# Patient Record
Sex: Male | Born: 1945 | Race: White | Hispanic: No | Marital: Married | Smoking: Never smoker
Health system: Southern US, Community
[De-identification: ages and names within clinical notes are randomized; demographics above are authoritative.]

## PROBLEM LIST (undated history)

## (undated) DIAGNOSIS — C801 Malignant (primary) neoplasm, unspecified: Secondary | ICD-10-CM

## (undated) DIAGNOSIS — N35919 Unspecified urethral stricture, male, unspecified site: Secondary | ICD-10-CM

## (undated) DIAGNOSIS — I10 Essential (primary) hypertension: Secondary | ICD-10-CM

## (undated) DIAGNOSIS — G629 Polyneuropathy, unspecified: Secondary | ICD-10-CM

## (undated) DIAGNOSIS — I2510 Atherosclerotic heart disease of native coronary artery without angina pectoris: Secondary | ICD-10-CM

## (undated) DIAGNOSIS — C679 Malignant neoplasm of bladder, unspecified: Secondary | ICD-10-CM

## (undated) DIAGNOSIS — C61 Malignant neoplasm of prostate: Secondary | ICD-10-CM

## (undated) HISTORY — PX: OTHER SURGICAL HISTORY: SHX169

## (undated) HISTORY — PX: INSERTION PROSTATE RADIATION SEED: SUR718

## (undated) HISTORY — DX: Polyneuropathy, unspecified: G62.9

## (undated) HISTORY — DX: Unspecified urethral stricture, male, unspecified site: N35.919

## (undated) HISTORY — PX: REFRACTIVE SURGERY: SHX103

## (undated) HISTORY — PX: CORONARY ARTERY BYPASS GRAFT: SHX141

## (undated) HISTORY — DX: Malignant (primary) neoplasm, unspecified: C80.1

## (undated) HISTORY — DX: Atherosclerotic heart disease of native coronary artery without angina pectoris: I25.10

---

## 1997-09-10 HISTORY — PX: OTHER SURGICAL HISTORY: SHX169

## 1998-02-08 ENCOUNTER — Encounter (HOSPITAL_COMMUNITY): Admission: RE | Admit: 1998-02-08 | Discharge: 1998-05-09 | Payer: Self-pay | Admitting: Cardiology

## 1998-11-12 ENCOUNTER — Ambulatory Visit (HOSPITAL_COMMUNITY): Admission: RE | Admit: 1998-11-12 | Discharge: 1998-11-12 | Payer: Self-pay

## 2000-01-30 ENCOUNTER — Encounter: Payer: Self-pay | Admitting: Hematology and Oncology

## 2000-01-30 ENCOUNTER — Encounter: Admission: RE | Admit: 2000-01-30 | Discharge: 2000-01-30 | Payer: Self-pay | Admitting: Hematology and Oncology

## 2001-03-16 ENCOUNTER — Encounter: Admission: RE | Admit: 2001-03-16 | Discharge: 2001-03-16 | Payer: Self-pay | Admitting: Hematology and Oncology

## 2001-03-16 ENCOUNTER — Encounter: Payer: Self-pay | Admitting: Hematology and Oncology

## 2002-03-07 ENCOUNTER — Ambulatory Visit (HOSPITAL_COMMUNITY): Admission: RE | Admit: 2002-03-07 | Discharge: 2002-03-07 | Payer: Self-pay | Admitting: Gastroenterology

## 2005-06-04 ENCOUNTER — Encounter (INDEPENDENT_AMBULATORY_CARE_PROVIDER_SITE_OTHER): Payer: Self-pay | Admitting: *Deleted

## 2005-06-04 ENCOUNTER — Ambulatory Visit (HOSPITAL_COMMUNITY): Admission: RE | Admit: 2005-06-04 | Discharge: 2005-06-04 | Payer: Self-pay | Admitting: Gastroenterology

## 2005-07-31 ENCOUNTER — Encounter (INDEPENDENT_AMBULATORY_CARE_PROVIDER_SITE_OTHER): Payer: Self-pay | Admitting: *Deleted

## 2005-07-31 ENCOUNTER — Inpatient Hospital Stay (HOSPITAL_COMMUNITY): Admission: RE | Admit: 2005-07-31 | Discharge: 2005-08-05 | Payer: Self-pay | Admitting: Surgery

## 2005-08-10 ENCOUNTER — Inpatient Hospital Stay (HOSPITAL_COMMUNITY): Admission: EM | Admit: 2005-08-10 | Discharge: 2005-08-18 | Payer: Self-pay | Admitting: Emergency Medicine

## 2005-08-11 ENCOUNTER — Ambulatory Visit: Payer: Self-pay | Admitting: Internal Medicine

## 2005-09-02 ENCOUNTER — Ambulatory Visit: Payer: Self-pay | Admitting: Hematology and Oncology

## 2005-09-10 ENCOUNTER — Ambulatory Visit (HOSPITAL_BASED_OUTPATIENT_CLINIC_OR_DEPARTMENT_OTHER): Admission: RE | Admit: 2005-09-10 | Discharge: 2005-09-10 | Payer: Self-pay | Admitting: Surgery

## 2005-09-12 ENCOUNTER — Ambulatory Visit (HOSPITAL_COMMUNITY): Admission: RE | Admit: 2005-09-12 | Discharge: 2005-09-12 | Payer: Self-pay | Admitting: Hematology and Oncology

## 2005-10-15 ENCOUNTER — Ambulatory Visit: Payer: Self-pay | Admitting: Family Medicine

## 2005-10-21 ENCOUNTER — Ambulatory Visit: Payer: Self-pay | Admitting: Hematology and Oncology

## 2005-10-23 ENCOUNTER — Ambulatory Visit: Admission: RE | Admit: 2005-10-23 | Discharge: 2005-10-23 | Payer: Self-pay | Admitting: Hematology and Oncology

## 2005-10-23 ENCOUNTER — Encounter (INDEPENDENT_AMBULATORY_CARE_PROVIDER_SITE_OTHER): Payer: Self-pay | Admitting: Cardiology

## 2005-10-31 ENCOUNTER — Ambulatory Visit: Payer: Self-pay | Admitting: Family Medicine

## 2005-11-14 ENCOUNTER — Ambulatory Visit: Payer: Self-pay | Admitting: Family Medicine

## 2005-12-05 ENCOUNTER — Ambulatory Visit: Payer: Self-pay | Admitting: Family Medicine

## 2005-12-10 ENCOUNTER — Ambulatory Visit: Payer: Self-pay | Admitting: Hematology and Oncology

## 2005-12-26 ENCOUNTER — Ambulatory Visit: Payer: Self-pay | Admitting: Family Medicine

## 2006-02-11 ENCOUNTER — Ambulatory Visit: Payer: Self-pay | Admitting: Hematology and Oncology

## 2006-02-11 LAB — CBC WITH DIFFERENTIAL/PLATELET
BASO%: 0.3 % (ref 0.0–2.0)
Eosinophils Absolute: 0.1 10*3/uL (ref 0.0–0.5)
MCHC: 34.2 g/dL (ref 32.0–35.9)
MONO#: 0.6 10*3/uL (ref 0.1–0.9)
MONO%: 10.9 % (ref 0.0–13.0)
NEUT#: 3 10*3/uL (ref 1.5–6.5)
RBC: 4.3 10*6/uL (ref 4.20–5.71)
RDW: 15.9 % — ABNORMAL HIGH (ref 11.2–14.6)
WBC: 5.4 10*3/uL (ref 4.0–10.0)

## 2006-02-11 LAB — COMPREHENSIVE METABOLIC PANEL
ALT: 25 U/L (ref 0–40)
Albumin: 4.3 g/dL (ref 3.5–5.2)
Alkaline Phosphatase: 52 U/L (ref 39–117)
CO2: 27 mEq/L (ref 19–32)
Glucose, Bld: 212 mg/dL — ABNORMAL HIGH (ref 70–99)
Potassium: 4.6 mEq/L (ref 3.5–5.3)
Sodium: 140 mEq/L (ref 135–145)
Total Bilirubin: 0.5 mg/dL (ref 0.3–1.2)
Total Protein: 6.9 g/dL (ref 6.0–8.3)

## 2006-03-11 ENCOUNTER — Ambulatory Visit (HOSPITAL_COMMUNITY): Admission: RE | Admit: 2006-03-11 | Discharge: 2006-03-11 | Payer: Self-pay | Admitting: Hematology and Oncology

## 2006-03-16 LAB — CBC WITH DIFFERENTIAL/PLATELET
BASO%: 0.3 % (ref 0.0–2.0)
EOS%: 1 % (ref 0.0–7.0)
LYMPH%: 27 % (ref 14.0–48.0)
MCHC: 34.5 g/dL (ref 32.0–35.9)
MCV: 94.4 fL (ref 81.6–98.0)
MONO%: 11.1 % (ref 0.0–13.0)
Platelets: 224 10*3/uL (ref 145–400)
RBC: 4.37 10*6/uL (ref 4.20–5.71)
RDW: 14.7 % — ABNORMAL HIGH (ref 11.2–14.6)

## 2006-03-16 LAB — COMPREHENSIVE METABOLIC PANEL
Albumin: 4.2 g/dL (ref 3.5–5.2)
BUN: 20 mg/dL (ref 6–23)
CO2: 28 mEq/L (ref 19–32)
Calcium: 9.6 mg/dL (ref 8.4–10.5)
Chloride: 100 mEq/L (ref 96–112)
Glucose, Bld: 255 mg/dL — ABNORMAL HIGH (ref 70–99)
Potassium: 4.9 mEq/L (ref 3.5–5.3)
Total Protein: 7 g/dL (ref 6.0–8.3)

## 2006-03-27 ENCOUNTER — Ambulatory Visit: Payer: Self-pay | Admitting: Family Medicine

## 2006-04-27 ENCOUNTER — Ambulatory Visit: Payer: Self-pay | Admitting: Hematology and Oncology

## 2006-04-27 ENCOUNTER — Ambulatory Visit: Payer: Self-pay | Admitting: Family Medicine

## 2006-05-18 ENCOUNTER — Ambulatory Visit: Payer: Self-pay | Admitting: Family Medicine

## 2006-06-08 ENCOUNTER — Ambulatory Visit: Payer: Self-pay | Admitting: Family Medicine

## 2006-06-17 ENCOUNTER — Ambulatory Visit: Payer: Self-pay | Admitting: Hematology and Oncology

## 2006-06-19 LAB — CBC WITH DIFFERENTIAL/PLATELET
Basophils Absolute: 0 10*3/uL (ref 0.0–0.1)
EOS%: 1.8 % (ref 0.0–7.0)
Eosinophils Absolute: 0.1 10*3/uL (ref 0.0–0.5)
HGB: 14.2 g/dL (ref 13.0–17.1)
MCV: 94.6 fL (ref 81.6–98.0)
MONO%: 11 % (ref 0.0–13.0)
NEUT#: 2.8 10*3/uL (ref 1.5–6.5)
RBC: 4.41 10*6/uL (ref 4.20–5.71)
RDW: 14.2 % (ref 11.2–14.6)
WBC: 4.9 10*3/uL (ref 4.0–10.0)
lymph#: 1.5 10*3/uL (ref 0.9–3.3)

## 2006-06-19 LAB — COMPREHENSIVE METABOLIC PANEL
ALT: 23 U/L (ref 0–40)
Albumin: 4.4 g/dL (ref 3.5–5.2)
Alkaline Phosphatase: 48 U/L (ref 39–117)
CO2: 23 mEq/L (ref 19–32)
Potassium: 4.8 mEq/L (ref 3.5–5.3)
Sodium: 137 mEq/L (ref 135–145)
Total Bilirubin: 0.6 mg/dL (ref 0.3–1.2)
Total Protein: 7.1 g/dL (ref 6.0–8.3)

## 2006-06-19 LAB — CEA: CEA: 1.5 ng/mL (ref 0.0–5.0)

## 2006-06-26 ENCOUNTER — Ambulatory Visit: Payer: Self-pay | Admitting: Physical Medicine & Rehabilitation

## 2006-06-29 ENCOUNTER — Ambulatory Visit: Payer: Self-pay | Admitting: Family Medicine

## 2006-07-02 ENCOUNTER — Ambulatory Visit: Payer: Self-pay | Admitting: Family Medicine

## 2006-07-09 ENCOUNTER — Ambulatory Visit: Payer: Self-pay | Admitting: Family Medicine

## 2006-08-14 ENCOUNTER — Ambulatory Visit: Payer: Self-pay | Admitting: Physical Medicine & Rehabilitation

## 2006-08-18 DIAGNOSIS — D510 Vitamin B12 deficiency anemia due to intrinsic factor deficiency: Secondary | ICD-10-CM | POA: Insufficient documentation

## 2006-08-18 DIAGNOSIS — E7800 Pure hypercholesterolemia, unspecified: Secondary | ICD-10-CM | POA: Insufficient documentation

## 2006-08-18 DIAGNOSIS — F5232 Male orgasmic disorder: Secondary | ICD-10-CM | POA: Insufficient documentation

## 2006-08-18 DIAGNOSIS — E119 Type 2 diabetes mellitus without complications: Secondary | ICD-10-CM | POA: Insufficient documentation

## 2006-08-18 DIAGNOSIS — I10 Essential (primary) hypertension: Secondary | ICD-10-CM | POA: Insufficient documentation

## 2006-08-18 DIAGNOSIS — I2510 Atherosclerotic heart disease of native coronary artery without angina pectoris: Secondary | ICD-10-CM | POA: Insufficient documentation

## 2006-08-18 DIAGNOSIS — G609 Hereditary and idiopathic neuropathy, unspecified: Secondary | ICD-10-CM | POA: Insufficient documentation

## 2006-08-21 ENCOUNTER — Ambulatory Visit: Payer: Self-pay | Admitting: Family Medicine

## 2006-09-16 ENCOUNTER — Ambulatory Visit: Payer: Self-pay | Admitting: Hematology and Oncology

## 2006-09-18 ENCOUNTER — Encounter: Payer: Self-pay | Admitting: Family Medicine

## 2006-09-18 LAB — CBC WITH DIFFERENTIAL/PLATELET
BASO%: 0.3 % (ref 0.0–2.0)
Eosinophils Absolute: 0.1 10*3/uL (ref 0.0–0.5)
HCT: 44.7 % (ref 38.7–49.9)
MCHC: 34.9 g/dL (ref 32.0–35.9)
MONO#: 0.7 10*3/uL (ref 0.1–0.9)
NEUT#: 4.9 10*3/uL (ref 1.5–6.5)
RBC: 4.75 10*6/uL (ref 4.20–5.71)
WBC: 7.7 10*3/uL (ref 4.0–10.0)
lymph#: 2 10*3/uL (ref 0.9–3.3)

## 2006-09-18 LAB — COMPREHENSIVE METABOLIC PANEL
ALT: 29 U/L (ref 0–53)
Albumin: 4.5 g/dL (ref 3.5–5.2)
CO2: 27 mEq/L (ref 19–32)
Calcium: 10 mg/dL (ref 8.4–10.5)
Chloride: 101 mEq/L (ref 96–112)
Glucose, Bld: 203 mg/dL — ABNORMAL HIGH (ref 70–99)
Sodium: 138 mEq/L (ref 135–145)
Total Protein: 7.5 g/dL (ref 6.0–8.3)

## 2006-09-18 LAB — CONVERTED CEMR LAB
HCT: 44.7 %
WBC, blood: 7.7 10*3/uL

## 2006-09-18 LAB — CEA: CEA: 1.9 ng/mL (ref 0.0–5.0)

## 2006-09-21 ENCOUNTER — Ambulatory Visit (HOSPITAL_COMMUNITY): Admission: RE | Admit: 2006-09-21 | Discharge: 2006-09-21 | Payer: Self-pay | Admitting: Hematology and Oncology

## 2006-10-19 ENCOUNTER — Telehealth (INDEPENDENT_AMBULATORY_CARE_PROVIDER_SITE_OTHER): Payer: Self-pay | Admitting: *Deleted

## 2006-10-19 ENCOUNTER — Ambulatory Visit: Payer: Self-pay | Admitting: Family Medicine

## 2006-10-19 DIAGNOSIS — N3000 Acute cystitis without hematuria: Secondary | ICD-10-CM | POA: Insufficient documentation

## 2006-10-21 ENCOUNTER — Telehealth: Payer: Self-pay | Admitting: Family Medicine

## 2006-10-22 ENCOUNTER — Encounter: Payer: Self-pay | Admitting: Family Medicine

## 2006-10-22 LAB — CONVERTED CEMR LAB
BUN: 20 mg/dL (ref 6–23)
CO2: 23 meq/L (ref 19–32)
Chloride: 102 meq/L (ref 96–112)
Creatinine, Ser: 1.17 mg/dL (ref 0.40–1.50)

## 2006-10-23 ENCOUNTER — Telehealth: Payer: Self-pay | Admitting: Family Medicine

## 2006-10-28 ENCOUNTER — Telehealth (INDEPENDENT_AMBULATORY_CARE_PROVIDER_SITE_OTHER): Payer: Self-pay | Admitting: *Deleted

## 2006-10-28 ENCOUNTER — Encounter: Payer: Self-pay | Admitting: Family Medicine

## 2006-11-13 ENCOUNTER — Ambulatory Visit (HOSPITAL_BASED_OUTPATIENT_CLINIC_OR_DEPARTMENT_OTHER): Admission: RE | Admit: 2006-11-13 | Discharge: 2006-11-13 | Payer: Self-pay | Admitting: Surgery

## 2006-11-17 ENCOUNTER — Encounter: Payer: Self-pay | Admitting: Family Medicine

## 2006-12-07 ENCOUNTER — Ambulatory Visit: Payer: Self-pay | Admitting: Family Medicine

## 2006-12-07 ENCOUNTER — Telehealth: Payer: Self-pay | Admitting: Family Medicine

## 2006-12-07 LAB — CONVERTED CEMR LAB: Microalbumin U total vol: NORMAL mg/L

## 2007-01-07 ENCOUNTER — Encounter: Payer: Self-pay | Admitting: Family Medicine

## 2007-01-08 LAB — CONVERTED CEMR LAB
ALT: 27 units/L (ref 0–53)
AST: 23 units/L (ref 0–37)
Total CHOL/HDL Ratio: 2.4
VLDL: 18 mg/dL (ref 0–40)

## 2007-01-11 ENCOUNTER — Ambulatory Visit: Payer: Self-pay | Admitting: Family Medicine

## 2007-01-15 ENCOUNTER — Ambulatory Visit: Payer: Self-pay | Admitting: Physical Medicine & Rehabilitation

## 2007-01-19 ENCOUNTER — Ambulatory Visit: Payer: Self-pay | Admitting: Hematology and Oncology

## 2007-01-22 LAB — COMPREHENSIVE METABOLIC PANEL
ALT: 29 U/L (ref 0–53)
BUN: 24 mg/dL — ABNORMAL HIGH (ref 6–23)
CO2: 24 mEq/L (ref 19–32)
Calcium: 9.3 mg/dL (ref 8.4–10.5)
Chloride: 104 mEq/L (ref 96–112)
Creatinine, Ser: 1.21 mg/dL (ref 0.40–1.50)
Glucose, Bld: 189 mg/dL — ABNORMAL HIGH (ref 70–99)
Total Bilirubin: 0.6 mg/dL (ref 0.3–1.2)

## 2007-01-22 LAB — CBC WITH DIFFERENTIAL/PLATELET
BASO%: 0.5 % (ref 0.0–2.0)
EOS%: 2 % (ref 0.0–7.0)
HCT: 41.5 % (ref 38.7–49.9)
LYMPH%: 23.8 % (ref 14.0–48.0)
MCH: 32.1 pg (ref 28.0–33.4)
MCHC: 34.5 g/dL (ref 32.0–35.9)
MONO#: 0.6 10*3/uL (ref 0.1–0.9)
MONO%: 10.2 % (ref 0.0–13.0)
NEUT%: 63.5 % (ref 40.0–75.0)
Platelets: 193 10*3/uL (ref 145–400)
RBC: 4.47 10*6/uL (ref 4.20–5.71)
WBC: 6.1 10*3/uL (ref 4.0–10.0)

## 2007-01-22 LAB — CEA: CEA: 1.5 ng/mL (ref 0.0–5.0)

## 2007-03-19 ENCOUNTER — Ambulatory Visit: Payer: Self-pay | Admitting: Physical Medicine & Rehabilitation

## 2007-03-29 ENCOUNTER — Telehealth: Payer: Self-pay | Admitting: Family Medicine

## 2007-03-29 ENCOUNTER — Ambulatory Visit: Payer: Self-pay | Admitting: Family Medicine

## 2007-06-09 ENCOUNTER — Ambulatory Visit: Payer: Self-pay | Admitting: Hematology and Oncology

## 2007-06-11 ENCOUNTER — Ambulatory Visit (HOSPITAL_COMMUNITY): Admission: RE | Admit: 2007-06-11 | Discharge: 2007-06-11 | Payer: Self-pay | Admitting: Hematology and Oncology

## 2007-06-11 LAB — COMPREHENSIVE METABOLIC PANEL
ALT: 34 U/L (ref 0–53)
BUN: 35 mg/dL — ABNORMAL HIGH (ref 6–23)
CO2: 26 mEq/L (ref 19–32)
Creatinine, Ser: 1.42 mg/dL (ref 0.40–1.50)
Total Bilirubin: 0.8 mg/dL (ref 0.3–1.2)

## 2007-06-11 LAB — CBC WITH DIFFERENTIAL/PLATELET
HCT: 42.8 % (ref 38.7–49.9)
HGB: 15.1 g/dL (ref 13.0–17.1)
LYMPH%: 34.4 % (ref 14.0–48.0)
MCH: 32.7 pg (ref 28.0–33.4)
MONO#: 0.7 10*3/uL (ref 0.1–0.9)
MONO%: 11 % (ref 0.0–13.0)
NEUT%: 52.5 % (ref 40.0–75.0)
RDW: 13.4 % (ref 11.2–14.6)
WBC: 6.8 10*3/uL (ref 4.0–10.0)
lymph#: 2.3 10*3/uL (ref 0.9–3.3)

## 2007-06-11 LAB — CEA: CEA: 1.5 ng/mL (ref 0.0–5.0)

## 2007-06-16 ENCOUNTER — Encounter: Payer: Self-pay | Admitting: Family Medicine

## 2007-06-18 LAB — CONVERTED CEMR LAB
Albumin: 4.2 g/dL
CO2: 26 meq/L
Glucose, Bld: 198 mg/dL
Potassium: 4.8 meq/L
Sodium: 138 meq/L
Total Protein: 7 g/dL

## 2007-06-28 ENCOUNTER — Encounter: Payer: Self-pay | Admitting: Family Medicine

## 2007-06-30 ENCOUNTER — Ambulatory Visit: Payer: Self-pay | Admitting: Family Medicine

## 2007-07-01 ENCOUNTER — Encounter: Payer: Self-pay | Admitting: Family Medicine

## 2007-07-02 ENCOUNTER — Ambulatory Visit: Payer: Self-pay | Admitting: Physical Medicine & Rehabilitation

## 2007-07-19 ENCOUNTER — Encounter: Payer: Self-pay | Admitting: Family Medicine

## 2007-08-06 ENCOUNTER — Encounter: Payer: Self-pay | Admitting: Family Medicine

## 2007-08-16 ENCOUNTER — Encounter: Payer: Self-pay | Admitting: Family Medicine

## 2007-08-31 ENCOUNTER — Encounter: Payer: Self-pay | Admitting: Family Medicine

## 2007-10-01 ENCOUNTER — Ambulatory Visit: Payer: Self-pay | Admitting: Family Medicine

## 2007-10-01 ENCOUNTER — Ambulatory Visit: Payer: Self-pay | Admitting: Physical Medicine & Rehabilitation

## 2007-10-01 DIAGNOSIS — M545 Low back pain, unspecified: Secondary | ICD-10-CM | POA: Insufficient documentation

## 2007-10-01 LAB — CONVERTED CEMR LAB: Hgb A1c MFr Bld: 7.5 %

## 2007-12-06 ENCOUNTER — Encounter: Payer: Self-pay | Admitting: Family Medicine

## 2007-12-13 ENCOUNTER — Telehealth: Payer: Self-pay | Admitting: Family Medicine

## 2007-12-14 ENCOUNTER — Encounter: Payer: Self-pay | Admitting: Family Medicine

## 2008-01-11 ENCOUNTER — Ambulatory Visit: Payer: Self-pay | Admitting: Hematology and Oncology

## 2008-01-13 LAB — CBC WITH DIFFERENTIAL/PLATELET
BASO%: 0.4 % (ref 0.0–2.0)
Basophils Absolute: 0 10*3/uL (ref 0.0–0.1)
EOS%: 1.5 % (ref 0.0–7.0)
Eosinophils Absolute: 0.1 10*3/uL (ref 0.0–0.5)
HCT: 41.6 % (ref 38.7–49.9)
HGB: 14.4 g/dL (ref 13.0–17.1)
LYMPH%: 30.2 % (ref 14.0–48.0)
MCH: 32 pg (ref 28.0–33.4)
MCHC: 34.6 g/dL (ref 32.0–35.9)
MCV: 92.6 fL (ref 81.6–98.0)
MONO#: 0.6 10*3/uL (ref 0.1–0.9)
MONO%: 9.5 % (ref 0.0–13.0)
NEUT#: 3.7 10*3/uL (ref 1.5–6.5)
NEUT%: 58.4 % (ref 40.0–75.0)
Platelets: 206 10*3/uL (ref 145–400)
RBC: 4.5 10*6/uL (ref 4.20–5.71)
RDW: 13.1 % (ref 11.2–14.6)
WBC: 6.3 10*3/uL (ref 4.0–10.0)
lymph#: 1.9 10*3/uL (ref 0.9–3.3)

## 2008-01-13 LAB — COMPREHENSIVE METABOLIC PANEL
ALT: 29 U/L (ref 0–53)
AST: 21 U/L (ref 0–37)
Albumin: 4.3 g/dL (ref 3.5–5.2)
Alkaline Phosphatase: 45 U/L (ref 39–117)
BUN: 24 mg/dL — ABNORMAL HIGH (ref 6–23)
CO2: 26 mEq/L (ref 19–32)
Calcium: 9.7 mg/dL (ref 8.4–10.5)
Chloride: 103 mEq/L (ref 96–112)
Creatinine, Ser: 1.18 mg/dL (ref 0.40–1.50)
Glucose, Bld: 183 mg/dL — ABNORMAL HIGH (ref 70–99)
Potassium: 5.1 mEq/L (ref 3.5–5.3)
Sodium: 137 mEq/L (ref 135–145)
Total Bilirubin: 0.5 mg/dL (ref 0.3–1.2)
Total Protein: 7.1 g/dL (ref 6.0–8.3)

## 2008-01-13 LAB — CEA: CEA: 1 ng/mL (ref 0.0–5.0)

## 2008-01-13 LAB — PSA, MEDICARE: PSA: 1.04 ng/mL (ref 0.10–4.00)

## 2008-01-17 ENCOUNTER — Ambulatory Visit (HOSPITAL_COMMUNITY): Admission: RE | Admit: 2008-01-17 | Discharge: 2008-01-17 | Payer: Self-pay | Admitting: Hematology and Oncology

## 2008-01-17 ENCOUNTER — Encounter: Payer: Self-pay | Admitting: Family Medicine

## 2008-01-21 ENCOUNTER — Ambulatory Visit: Payer: Self-pay | Admitting: Family Medicine

## 2008-01-21 ENCOUNTER — Ambulatory Visit: Payer: Self-pay | Admitting: Physical Medicine & Rehabilitation

## 2008-01-21 LAB — CONVERTED CEMR LAB

## 2008-01-31 ENCOUNTER — Encounter: Payer: Self-pay | Admitting: Family Medicine

## 2008-02-01 ENCOUNTER — Encounter: Payer: Self-pay | Admitting: Family Medicine

## 2008-02-29 ENCOUNTER — Encounter: Payer: Self-pay | Admitting: Family Medicine

## 2008-02-29 LAB — CONVERTED CEMR LAB
ALT: 33 units/L
AST: 31 units/L
Calcium: 9.7 mg/dL
Chloride: 104 meq/L
Creatinine, Ser: 1 mg/dL
Hgb A1c MFr Bld: 6.4 %
LDL Cholesterol: 68 mg/dL
Microalb, Ur: 0.3 mg/dL
Potassium: 4.6 meq/L

## 2008-03-22 ENCOUNTER — Encounter: Payer: Self-pay | Admitting: Family Medicine

## 2008-05-09 ENCOUNTER — Encounter: Payer: Self-pay | Admitting: Family Medicine

## 2008-05-22 ENCOUNTER — Ambulatory Visit: Payer: Self-pay | Admitting: Family Medicine

## 2008-05-26 ENCOUNTER — Ambulatory Visit: Payer: Self-pay | Admitting: Physical Medicine & Rehabilitation

## 2008-06-21 ENCOUNTER — Encounter: Payer: Self-pay | Admitting: Family Medicine

## 2008-06-21 LAB — CONVERTED CEMR LAB

## 2008-07-26 ENCOUNTER — Ambulatory Visit: Payer: Self-pay | Admitting: Hematology and Oncology

## 2008-07-28 LAB — COMPREHENSIVE METABOLIC PANEL
AST: 28 U/L (ref 0–37)
Albumin: 4.1 g/dL (ref 3.5–5.2)
Alkaline Phosphatase: 43 U/L (ref 39–117)
BUN: 17 mg/dL (ref 6–23)
Creatinine, Ser: 1.19 mg/dL (ref 0.40–1.50)
Glucose, Bld: 171 mg/dL — ABNORMAL HIGH (ref 70–99)
Total Bilirubin: 0.6 mg/dL (ref 0.3–1.2)

## 2008-07-28 LAB — CBC WITH DIFFERENTIAL/PLATELET
Basophils Absolute: 0 10*3/uL (ref 0.0–0.1)
EOS%: 1 % (ref 0.0–7.0)
Eosinophils Absolute: 0.1 10*3/uL (ref 0.0–0.5)
HCT: 43.8 % (ref 38.7–49.9)
HGB: 14.8 g/dL (ref 13.0–17.1)
LYMPH%: 27.1 % (ref 14.0–48.0)
MCH: 32.2 pg (ref 28.0–33.4)
MCV: 95.1 fL (ref 81.6–98.0)
MONO%: 9.8 % (ref 0.0–13.0)
NEUT#: 3.7 10*3/uL (ref 1.5–6.5)
NEUT%: 61.5 % (ref 40.0–75.0)
Platelets: 190 10*3/uL (ref 145–400)
RDW: 13.6 % (ref 11.2–14.6)

## 2008-07-28 LAB — CEA: CEA: 1.3 ng/mL (ref 0.0–5.0)

## 2008-07-31 ENCOUNTER — Ambulatory Visit (HOSPITAL_COMMUNITY): Admission: RE | Admit: 2008-07-31 | Discharge: 2008-07-31 | Payer: Self-pay | Admitting: Hematology and Oncology

## 2008-08-08 ENCOUNTER — Encounter: Payer: Self-pay | Admitting: Family Medicine

## 2008-08-09 ENCOUNTER — Ambulatory Visit: Payer: Self-pay | Admitting: Family Medicine

## 2008-08-29 ENCOUNTER — Ambulatory Visit: Payer: Self-pay | Admitting: Family Medicine

## 2008-09-01 ENCOUNTER — Encounter: Payer: Self-pay | Admitting: Family Medicine

## 2008-09-01 LAB — HM COLONOSCOPY: HM Colonoscopy: NORMAL

## 2008-09-12 ENCOUNTER — Encounter: Payer: Self-pay | Admitting: Family Medicine

## 2008-09-22 ENCOUNTER — Ambulatory Visit: Payer: Self-pay | Admitting: Family Medicine

## 2008-09-22 LAB — CONVERTED CEMR LAB: Hgb A1c MFr Bld: 6.9 %

## 2008-10-13 ENCOUNTER — Ambulatory Visit: Payer: Self-pay | Admitting: Physical Medicine & Rehabilitation

## 2008-12-27 ENCOUNTER — Ambulatory Visit: Payer: Self-pay | Admitting: Family Medicine

## 2009-01-10 ENCOUNTER — Telehealth (INDEPENDENT_AMBULATORY_CARE_PROVIDER_SITE_OTHER): Payer: Self-pay | Admitting: *Deleted

## 2009-01-22 ENCOUNTER — Ambulatory Visit: Payer: Self-pay | Admitting: Hematology and Oncology

## 2009-01-24 LAB — CBC WITH DIFFERENTIAL/PLATELET
BASO%: 0 % (ref 0.0–2.0)
EOS%: 1.1 % (ref 0.0–7.0)
HCT: 44.1 % (ref 38.4–49.9)
MCH: 32.2 pg (ref 27.2–33.4)
MCHC: 34.3 g/dL (ref 32.0–36.0)
MCV: 94.1 fL (ref 79.3–98.0)
MONO%: 10.1 % (ref 0.0–14.0)
NEUT%: 58.8 % (ref 39.0–75.0)
RDW: 13 % (ref 11.0–14.6)
lymph#: 2 10*3/uL (ref 0.9–3.3)

## 2009-01-24 LAB — COMPREHENSIVE METABOLIC PANEL
ALT: 27 U/L (ref 0–53)
AST: 22 U/L (ref 0–37)
Alkaline Phosphatase: 52 U/L (ref 39–117)
Calcium: 9.7 mg/dL (ref 8.4–10.5)
Chloride: 104 mEq/L (ref 96–112)
Creatinine, Ser: 1.24 mg/dL (ref 0.40–1.50)

## 2009-01-26 ENCOUNTER — Ambulatory Visit (HOSPITAL_COMMUNITY): Admission: RE | Admit: 2009-01-26 | Discharge: 2009-01-26 | Payer: Self-pay | Admitting: Hematology and Oncology

## 2009-01-26 ENCOUNTER — Encounter: Payer: Self-pay | Admitting: Family Medicine

## 2009-01-31 ENCOUNTER — Encounter: Payer: Self-pay | Admitting: Family Medicine

## 2009-04-18 ENCOUNTER — Ambulatory Visit: Payer: Self-pay | Admitting: Family Medicine

## 2009-04-18 LAB — CONVERTED CEMR LAB: Creatinine,U: 300 mg/dL

## 2009-05-18 ENCOUNTER — Ambulatory Visit: Payer: Self-pay | Admitting: Physical Medicine & Rehabilitation

## 2009-05-29 ENCOUNTER — Ambulatory Visit: Payer: Self-pay | Admitting: Diagnostic Radiology

## 2009-05-29 ENCOUNTER — Emergency Department (HOSPITAL_BASED_OUTPATIENT_CLINIC_OR_DEPARTMENT_OTHER): Admission: EM | Admit: 2009-05-29 | Discharge: 2009-05-29 | Payer: Self-pay | Admitting: Emergency Medicine

## 2009-07-18 ENCOUNTER — Ambulatory Visit: Payer: Self-pay | Admitting: Family Medicine

## 2009-07-24 ENCOUNTER — Encounter: Payer: Self-pay | Admitting: Family Medicine

## 2009-09-21 ENCOUNTER — Ambulatory Visit: Payer: Self-pay | Admitting: Family Medicine

## 2009-11-01 ENCOUNTER — Ambulatory Visit: Payer: Self-pay | Admitting: Family Medicine

## 2009-12-03 ENCOUNTER — Ambulatory Visit: Payer: Self-pay | Admitting: Hematology and Oncology

## 2009-12-03 LAB — CBC WITH DIFFERENTIAL/PLATELET
BASO%: 0.5 % (ref 0.0–2.0)
EOS%: 1.5 % (ref 0.0–7.0)
Eosinophils Absolute: 0.1 10*3/uL (ref 0.0–0.5)
LYMPH%: 34.2 % (ref 14.0–49.0)
MCH: 31.9 pg (ref 27.2–33.4)
MCHC: 33.6 g/dL (ref 32.0–36.0)
MCV: 94.8 fL (ref 79.3–98.0)
MONO%: 11.7 % (ref 0.0–14.0)
NEUT#: 3.6 10*3/uL (ref 1.5–6.5)
Platelets: 227 10*3/uL (ref 140–400)
RBC: 4.72 10*6/uL (ref 4.20–5.82)
RDW: 13.3 % (ref 11.0–14.6)

## 2009-12-03 LAB — CEA: CEA: 1.3 ng/mL (ref 0.0–5.0)

## 2009-12-03 LAB — COMPREHENSIVE METABOLIC PANEL
ALT: 29 U/L (ref 0–53)
AST: 22 U/L (ref 0–37)
Albumin: 4.5 g/dL (ref 3.5–5.2)
Alkaline Phosphatase: 54 U/L (ref 39–117)
Glucose, Bld: 171 mg/dL — ABNORMAL HIGH (ref 70–99)
Potassium: 4.2 mEq/L (ref 3.5–5.3)
Sodium: 138 mEq/L (ref 135–145)
Total Bilirubin: 0.3 mg/dL (ref 0.3–1.2)
Total Protein: 7.4 g/dL (ref 6.0–8.3)

## 2009-12-05 ENCOUNTER — Encounter: Payer: Self-pay | Admitting: Family Medicine

## 2010-01-28 ENCOUNTER — Encounter: Payer: Self-pay | Admitting: Family Medicine

## 2010-02-05 ENCOUNTER — Ambulatory Visit: Payer: Self-pay | Admitting: Family Medicine

## 2010-02-11 ENCOUNTER — Encounter: Payer: Self-pay | Admitting: Family Medicine

## 2010-02-14 ENCOUNTER — Telehealth: Payer: Self-pay | Admitting: Family Medicine

## 2010-02-27 ENCOUNTER — Ambulatory Visit: Payer: Self-pay | Admitting: Family Medicine

## 2010-02-27 LAB — CONVERTED CEMR LAB
Bilirubin Urine: NEGATIVE
Blood in Urine, dipstick: NEGATIVE
Glucose, Urine, Semiquant: NEGATIVE
Hgb A1c MFr Bld: 7.2 %
Ketones, urine, test strip: NEGATIVE
Nitrite: NEGATIVE
Specific Gravity, Urine: >=1.03
pH: 5

## 2010-02-28 ENCOUNTER — Encounter: Payer: Self-pay | Admitting: Family Medicine

## 2010-05-07 ENCOUNTER — Inpatient Hospital Stay (HOSPITAL_COMMUNITY): Admission: EM | Admit: 2010-05-07 | Discharge: 2010-05-09 | Payer: Self-pay | Admitting: Emergency Medicine

## 2010-05-08 ENCOUNTER — Encounter (INDEPENDENT_AMBULATORY_CARE_PROVIDER_SITE_OTHER): Payer: Self-pay | Admitting: Internal Medicine

## 2010-06-10 ENCOUNTER — Encounter: Payer: Self-pay | Admitting: Family Medicine

## 2010-06-24 ENCOUNTER — Encounter: Payer: Self-pay | Admitting: Family Medicine

## 2010-06-25 LAB — CONVERTED CEMR LAB
Albumin: 4.4 g/dL (ref 3.5–5.2)
BUN: 20 mg/dL (ref 6–23)
CO2: 22 meq/L (ref 19–32)
Calcium: 9.3 mg/dL (ref 8.4–10.5)
Chloride: 105 meq/L (ref 96–112)
Cholesterol: 119 mg/dL (ref 0–200)
Creatinine, Ser: 1.19 mg/dL (ref 0.40–1.50)
HDL: 42 mg/dL (ref >39)
Hemoglobin: 12.1 g/dL — ABNORMAL LOW (ref 13.0–17.0)
Lymphocytes Relative: 37 % (ref 12–46)
Lymphs Abs: 1.9 10*3/uL (ref 0.7–4.0)
Monocytes Absolute: 0.6 10*3/uL (ref 0.1–1.0)
Monocytes Relative: 12 % (ref 3–12)
Neutro Abs: 2.5 10*3/uL (ref 1.7–7.7)
Potassium: 4.7 meq/L (ref 3.5–5.3)
RBC: 4.48 M/uL (ref 4.22–5.81)
Total CHOL/HDL Ratio: 2.8
WBC: 5 10*3/uL (ref 4.0–10.5)

## 2010-06-28 ENCOUNTER — Ambulatory Visit: Payer: Self-pay | Admitting: Family Medicine

## 2010-06-28 DIAGNOSIS — K921 Melena: Secondary | ICD-10-CM | POA: Insufficient documentation

## 2010-06-28 LAB — CONVERTED CEMR LAB
Albumin/Creatinine Ratio, Urine, POC: <30
Creatinine,U: 300 mg/dL
Hgb A1c MFr Bld: 6.7 %
Iron: 32 ug/dL — ABNORMAL LOW (ref 42–165)
Saturation Ratios: 7 % — ABNORMAL LOW (ref 20–55)
TIBC: 431 ug/dL (ref 215–435)

## 2010-08-05 ENCOUNTER — Encounter: Payer: Self-pay | Admitting: Family Medicine

## 2010-08-05 LAB — CONVERTED CEMR LAB: Hgb A1c MFr Bld: 6.9 %

## 2010-08-23 ENCOUNTER — Ambulatory Visit: Payer: Self-pay | Admitting: Hematology and Oncology

## 2010-08-26 LAB — CBC WITH DIFFERENTIAL/PLATELET
Basophils Absolute: 0 10*3/uL (ref 0.0–0.1)
EOS%: 2.9 % (ref 0.0–7.0)
HCT: 41.9 % (ref 38.4–49.9)
HGB: 13.9 g/dL (ref 13.0–17.1)
LYMPH%: 33.5 % (ref 14.0–49.0)
MCH: 29 pg (ref 27.2–33.4)
MCHC: 33.1 g/dL (ref 32.0–36.0)
NEUT%: 49.9 % (ref 39.0–75.0)
Platelets: 207 10*3/uL (ref 140–400)
lymph#: 1.8 10*3/uL (ref 0.9–3.3)

## 2010-08-26 LAB — COMPREHENSIVE METABOLIC PANEL
AST: 25 U/L (ref 0–37)
BUN: 21 mg/dL (ref 6–23)
CO2: 29 mEq/L (ref 19–32)
Calcium: 9.4 mg/dL (ref 8.4–10.5)
Chloride: 106 mEq/L (ref 96–112)
Creatinine, Ser: 1.11 mg/dL (ref 0.40–1.50)
Total Bilirubin: 0.8 mg/dL (ref 0.3–1.2)

## 2010-08-27 LAB — CEA: CEA: 1.2 ng/mL (ref 0.0–5.0)

## 2010-08-30 ENCOUNTER — Ambulatory Visit (HOSPITAL_COMMUNITY): Admission: RE | Admit: 2010-08-30 | Discharge: 2010-08-30 | Payer: Self-pay | Admitting: Hematology and Oncology

## 2010-08-30 ENCOUNTER — Encounter: Payer: Self-pay | Admitting: Family Medicine

## 2010-08-30 ENCOUNTER — Ambulatory Visit: Payer: Self-pay | Admitting: Hematology and Oncology

## 2010-09-02 ENCOUNTER — Ambulatory Visit: Payer: Self-pay | Admitting: Family Medicine

## 2010-09-02 DIAGNOSIS — D509 Iron deficiency anemia, unspecified: Secondary | ICD-10-CM | POA: Insufficient documentation

## 2010-09-05 LAB — CONVERTED CEMR LAB
HCT: 45 % (ref 39.0–52.0)
Hemoglobin: 14.8 g/dL (ref 13.0–17.0)
Iron: 119 ug/dL (ref 42–165)
Saturation Ratios: 28 % (ref 20–55)
UIBC: 306 ug/dL

## 2010-11-19 ENCOUNTER — Ambulatory Visit
Admission: RE | Admit: 2010-11-19 | Discharge: 2010-11-19 | Payer: Self-pay | Source: Home / Self Care | Attending: Family Medicine | Admitting: Family Medicine

## 2010-11-19 DIAGNOSIS — J0100 Acute maxillary sinusitis, unspecified: Secondary | ICD-10-CM | POA: Insufficient documentation

## 2010-12-10 NOTE — Letter (Signed)
[facility]   Imported By: Lanelle Bal 12/26/2009 10:27:58  _____________________________________________________________________  External Attachment:    Type:   Image     Comment:   External Document

## 2010-12-10 NOTE — Letter (Signed)
Summary: [facility]  [facility]   Imported By: Lanelle Bal 07/08/2010 10:27:35  _____________________________________________________________________  External Attachment:    Type:   Image     Comment:   External Document

## 2010-12-10 NOTE — Assessment & Plan Note (Signed)
Summary: f/u DM   Vital Signs:  Patient profile:   65 year old male Height:      73.25 inches Weight:      287 pounds BMI:     37.74 O2 Sat:      98 % on Room air Pulse rate:   83 / minute BP sitting:   122 / 65  (left arm) Cuff size:   large  Vitals Entered By: Payton Spark CMA (February 27, 2010 8:09 AM)  O2 Flow:  Room air CC: F/U DM.    Primary Care Provider:  Seymour Bars D.O.  CC:  F/U DM. Marland Kitchen  History of Present Illness: 65 yo WM presents for f/u DM with neuropathy.  He had his disability hearing with the [place] and has about 6 wks to hear back.  He is taking Levemir 30 units two times a day, Metformin 1000 mg two times a day and is injecting Novolog with BF and dinner.  His 2 hr PPs are <150 and his Am fastings are in the 120s.  He has fair dietary compliance.  He is doing limited walking at the gym about 3 x a wk.  Denies any CP or DOE.  He updated his eye exam in Feb of this year.    He started having dysuria last weekend.  Sees Dr Patsi Sears for frequent UTIs s/p a hx of urethral dilation, last seen in Jan.  He used to have RX for Cipro to keep at home but he ran out.  Denies fevers or GI upset.      Current Medications (verified): 1)  Prinivil 20 Mg Tabs (Lisinopril) .... 1/2  Tab By Mouth Daily 2)  Lorazepam  Tabs (Lorazepam Tabs) .Marland Kitchen.. 1 Tab Po Bid Prn Anxiety 3)  Metformin Hcl 1000 Mg Tabs (Metformin Hcl) .Marland Kitchen.. 1 Tab By Mouth Bid 4)  Zetia 10 Mg Tabs (Ezetimibe) .... Once Daily 5)  Levemir Flexpen 100 Unit/ml Soln (Insulin Detemir) .... 30 Units [place] Q Am and 30 Units [place] Q Pm 6)  Novolog Flexpen 100 Unit/ml Soln (Insulin Aspart) .... Use At Mealtime For Carb Counting/ Ssi  Allergies (verified): 1)  ! Oxycodone Hcl 2)  ! Morphine 3)  ! Nortriptyline Hcl 4)  ! Tegretol  Past History:  Past Medical History: Reviewed history from 02/05/2010 and no changes required. 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 Cholectomy  Colon Cancer 09/1997, recurrence  CABG Lasik eye surgery 2007  Social History: Reviewed history from 06/30/2007 and no changes required. Disable for neuropathy.  Used to work as  Production designer, theatre/television/film at [employer], Never smoked, Socially drinks alcohol, no drugs, decaf coffee.  Likes to play golf and go to gym.  Denies trouble driving.  Married to [place].  Review of Systems      See HPI  Physical Exam  General:  obese WM in NAD Head:  normocephalic and atraumatic.   Eyes:  pupils equal, pupils round, and pupils reactive to light.   Mouth:  pharynx pink and moist.   Neck:  no masses.   Lungs:  Normal respiratory effort, chest expands symmetrically. Lungs are clear to auscultation, no crackles or wheezes. Heart:  Normal rate and regular rhythm. S1 and S2 normal without gallop, murmur, click, rub or other extra sounds. Extremities:  no LE  edema Skin:  color normal.   Cervical Nodes:  No lymphadenopathy noted Psych:  good eye contact, not anxious appearing, and not depressed appearing.     Impression & Recommendations:  Problem # 1:  DIABETES MELLITUS II, UNCOMPLICATED (ICD-250.00) A1C OK at 7.2, down from 7.4.  Continue current meds.  Updated eye exam 12-2009.   Keep working on diet, exericise, wt loss.  Neuropathy stable -- has not tolerated RX meds.   His updated medication list for this problem includes:    Prinivil 20 Mg Tabs (Lisinopril) .Marland Kitchen... 1/2  tab by mouth daily    Metformin Hcl 1000 Mg Tabs (Metformin hcl) .Marland Kitchen... 1 tab by mouth bid    Levemir Flexpen 100 Unit/ml Soln (Insulin detemir) .Marland KitchenMarland KitchenMarland KitchenMarland Kitchen 30 units [place] q am and 30 units [place] q pm    Novolog Flexpen 100 Unit/ml Soln (Insulin aspart) ..... Use at mealtime for carb counting/ ssi  Orders: T-Comprehensive Metabolic Panel [id number]) Fingerstick  (36416) Hemoglobin A1C (83036)  Labs Reviewed: Creat: 1.0 (02/29/2008)   Microalbumin: 30 (04/18/2009)  Last Eye Exam: normal (12/11/2008) Reviewed HgBA1c results: 7.2 (02/27/2010)  7.4 (07/24/2009)  Problem # 2:  HYPERTENSION, BENIGN SYSTEMIC (ICD-401.1) BP at goal.  Update fasting labs.   His updated medication list for this problem includes:    Prinivil 20 Mg Tabs (Lisinopril) .Marland Kitchen... 1/2  tab by mouth daily  BP today: 122/65 Prior BP: 109/60 (02/05/2010)  Prior 10 Yr Risk Heart Disease: N/A (01/21/2008)  Labs Reviewed: K+: 4.6 (02/29/2008) Creat: : 1.0 (02/29/2008)   Chol: 134 (02/29/2008)   HDL: 38 (02/29/2008)   LDL: 68 (02/29/2008)   TG: 140 (02/29/2008)  Problem # 3:  HYPERCHOLESTEROLEMIA (ICD-272.0) Update fasting labs.  Had them done at the [place] in Jan but I have no record of this.  Hx of statin intolerance.   His updated medication list for this problem includes:    Zetia 10 Mg Tabs (Ezetimibe) ..... Once daily  Orders: T-Lipid Profile [id number])  Labs Reviewed: SGOT: 31 (02/29/2008)   SGPT: 33 (02/29/2008)  Lipid Goals: Chol Goal: 200 (01/21/2008)   HDL Goal: 40 (01/21/2008)   LDL Goal: 70 (01/21/2008)   TG Goal: 150 (01/21/2008)  Prior 10 Yr Risk Heart Disease: N/A (01/21/2008)   HDL:38 (02/29/2008), 45 (01/07/2007)  LDL:68 (02/29/2008), 46 (01/07/2007)  Chol:134 (02/29/2008), 109 (01/07/2007)  Trig:140 (02/29/2008), 90 (01/07/2007)  Problem # 4:  CYSTITIS, ACUTE (ICD-595.0) UA normal, sent for cx.  Hx of complicated UTI and urethral stricture, sees Dr Patsi Sears.  RFd Cipro 500 mg at bedtime x 7 days for now.   Orders: T-Culture, Urine ([id number]) UA Dipstick w/o Micro (automated)  (81003) Prescription Created Electronically [phone])  His updated medication list for this problem includes:    Ciprofloxacin Hcl 500 Mg Tabs (Ciprofloxacin hcl) .Marland Kitchen... 1 tab by mouth qpm x 7 days  Problem # 5:  PERNICIOUS ANEMIA (ICD-281.0) Update B12 level for hx of  pernicious anemia.   Hgb: 15.1 (06/18/2007)   Hct: 42.8 (06/18/2007)   Platelets: 196 (06/18/2007) WBC: 6.8 (06/18/2007) MCV: 92.9 (06/18/2007)     Orders: T-Vitamin B12 ([id number])  Complete Medication List: 1)  Prinivil 20 Mg Tabs (Lisinopril) .... 1/2  tab by mouth daily 2)  Lorazepam Tabs (Lorazepam tabs) .Marland Kitchen.. 1 tab po bid prn anxiety 3)  Metformin Hcl 1000 Mg Tabs (Metformin hcl) .Marland Kitchen.. 1 tab by mouth bid 4)  Zetia 10 Mg Tabs (Ezetimibe) .... Once daily 5)  Levemir Flexpen 100 Unit/ml Soln (Insulin detemir) .... 30 units  [place] q am and 30 units  q pm 6)  Novolog Flexpen 100 Unit/ml Soln (Insulin aspart) .... Use at mealtime for carb counting/ ssi 7)  Ciprofloxacin Hcl 500 Mg Tabs (Ciprofloxacin hcl) .Marland Kitchen.. 1 tab by mouth qpm x 7 days  Other Orders: T-CBC w/Diff ([id number])  Patient Instructions: 1)  UA: Normal.  Will call you w/ culture result in 48 hrs.   2)  Update fasting labs. 3)  Will call you w/ results. 4)  (last set on record 2009) 5)  A1C 7.2= Good control of diabetes.   6)  Return for f/u in 4 mos. Prescriptions: CIPROFLOXACIN HCL 500 MG TABS (CIPROFLOXACIN HCL) 1 tab by mouth qPM x 7 days  #7 tabs x 2   Entered and Authorized by:   Seymour Bars DO   Signed by:   Seymour Bars DO on 02/27/2010   Method used:   Electronically to        [facility] (retail)       [address] [place], [place]  [address]       Ph: [phone]       Fax: [phone]   RxID:   [id number]   Laboratory Results   Urine Tests    Routine Urinalysis   Color: yellow Appearance: Clear Glucose: negative   (Normal Range: Negative) Bilirubin: negative   (Normal Range: Negative) Ketone: negative   (Normal Range: Negative) Spec. Gravity: >=1.030   (Normal Range: 1.003-1.035) Blood: negative   (Normal Range: Negative) pH: 5.0   (Normal Range: 5.0-8.0) Protein: negative   (Normal Range: Negative) Urobilinogen: 0.2   (Normal Range: 0-1) Nitrite: negative   (Normal  Range: Negative) Leukocyte Esterace: negative   (Normal Range: Negative)     Blood Tests     HGBA1C: 7.2%   (Normal Range: Non-Diabetic - 3-6%   Control Diabetic - 6-8%)

## 2010-12-10 NOTE — Assessment & Plan Note (Signed)
Summary: neuropathy   Vital Signs:  Patient profile:   65 year old male Height:      73.25 inches Weight:      289 pounds BMI:     38.01 O2 Sat:      99 % on Room air Pulse rate:   80 / minute BP sitting:   109 / 60  (left arm) Cuff size:   large  Vitals Entered By: Payton Spark CMA (February 05, 2010 12:54 PM)  O2 Flow:  Room air CC: F/U to discuss letter   Primary Care Provider:  Seymour Bars D.O.  CC:  F/U to discuss letter.  History of Present Illness: 65 yo WM presents for discussion about his disability letter for the [place].    His last one letter was written in 08.  He just had an eval at the [place] but needs a supplemental letter. He has progressed as far has his peripheral neuropathy goes.  He has failed to do well on any RX medications and he has not been able to return to work.    Current Medications (verified): 1)  Prinivil 20 Mg Tabs (Lisinopril) .... 1/2  Tab By Mouth Daily 2)  Lorazepam  Tabs (Lorazepam Tabs) .Marland Kitchen.. 1 Tab Po Bid Prn Anxiety 3)  Metformin Hcl 1000 Mg Tabs (Metformin Hcl) .Marland Kitchen.. 1 Tab By Mouth Bid 4)  Zetia 10 Mg Tabs (Ezetimibe) .... Once Daily 5)  Levemir Flexpen 100 Unit/ml Soln (Insulin Detemir) .... 30 Units [place] Q Am and 30 Units [place] Q Pm 6)  Novolog Flexpen 100 Unit/ml Soln (Insulin Aspart) .... Use At Mealtime For Carb Counting/ Ssi  Allergies (verified): 1)  ! Oxycodone Hcl 2)  ! Morphine 3)  ! Nortriptyline Hcl 4)  ! 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 Cholectomy  Colon Cancer 09/1997, recurrence  CABG Lasik eye surgery 2007  Social History: Reviewed history from 06/30/2007 and no changes required. Disable for neuropathy.  Used to work as  Production designer, theatre/television/film at  [employer], Never smoked, Socially drinks alcohol, no drugs, decaf coffee.  Likes to play golf and go to gym.  Denies trouble driving.  Married to [place].  Review of Systems      See HPI  Physical Exam  General:  alert, well-developed, well-nourished, and well-hydrated.  obese Psych:  good eye contact, not anxious appearing, and not depressed appearing.     Impression & Recommendations:  Problem # 1:  NEUROPATHY, PERIPHERAL (ICD-356.9) Will write him a lettter to send to the [place] supporting his inablity to work due to progressively worsening peripheral neuropathy of the hands and feet, intolerant to medications.    Complete Medication List: 1)  Prinivil 20 Mg Tabs (Lisinopril) .... 1/2  tab by mouth daily 2)  Lorazepam Tabs (Lorazepam tabs) .Marland Kitchen.. 1 tab po bid prn anxiety 3)  Metformin Hcl 1000 Mg Tabs (Metformin hcl) .Marland Kitchen.. 1 tab by mouth bid 4)  Zetia 10 Mg Tabs (Ezetimibe) .... Once daily 5)  Levemir Flexpen 100 Unit/ml Soln (Insulin detemir) .... 30 units [place] q am and 30 units [place] q pm 6)  Novolog Flexpen 100 Unit/ml Soln (Insulin aspart) .... Use at mealtime for carb counting/ ssi

## 2010-12-10 NOTE — Assessment & Plan Note (Signed)
Summary: f/u iron def anemia   Vital Signs:  Patient profile:   65 year old male Height:      73.25 inches Weight:      287 pounds Pulse rate:   92 / minute BP sitting:   104 / 69  (right arm) Cuff size:   large  Vitals Entered By: Avon Gully CMA, Duncan Dull) (September 02, 2010 8:10 AM) CC: f/u anemia   Primary Care Provider:  Seymour Bars D.O.  CC:  f/u anemia.  History of Present Illness: 65 yo WM presents for f/u iron def anemia likely due to an episode of hematochezia back in June.  He was hospitalized and had an EGD and colonoscopy done that just showed some exudative inflammation around the anastomosis site from previous colon cancer by Dr Madilyn Fireman.   Denies abd pain, melena or hematochezia since he was hospitalized.  He is feeling better, on Tandem x 8 wks but taking every other day b/c of diarrhea. Fatgue has improved some.  Denies CP or DOE.  He had labs done at the [place] in Sept but is due for iron/ Hgb today.  Had flu shot at the [place].   Current Medications (verified): 1)  Prinivil 20 Mg Tabs (Lisinopril) .... 1/2  Tab By Mouth Daily 2)  Lorazepam  Tabs (Lorazepam Tabs) .Marland Kitchen.. 1 Tab Po Bid Prn Anxiety 3)  Metformin Hcl 1000 Mg Tabs (Metformin Hcl) .Marland Kitchen.. 1 Tab By Mouth Bid 4)  Zetia 10 Mg Tabs (Ezetimibe) .... Once Daily 5)  Levemir Flexpen 100 Unit/ml Soln (Insulin Detemir) .... 30 Units [place] Q Am and 30 Units [place] Q Pm 6)  Novolog Flexpen 100 Unit/ml Soln (Insulin Aspart) .... Use At Mealtime For Carb Counting/ Ssi 7)  Voltaren 1 % Gel (Diclofenac Sodium) .... Apply 4 Grams Qid As Needed For Lower Extremity Pain 8)  Tandem 162-115.2 Mg Caps (Ferrous Fum-Iron Polysacch) .Marland Kitchen.. 1 Tab By Mouth Daily  Allergies (verified): 1)  ! Oxycodone Hcl 2)  ! Morphine 3)  ! Nortriptyline Hcl 4)  ! Tegretol  Comments:  Nurse/Medical Assistant: The patient's medications and allergies were reviewed with the patient and were updated in the Medication and Allergy Lists. Avon Gully CMA,  Duncan Dull) (September 02, 2010 8:10 AM)  Past History:  Past Medical History: Reviewed history from 06/28/2010 and no changes required. 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 Cholectomy  Colon Cancer 09/1997, recurrence  CABG Lasik eye surgery 2007  Social History: Reviewed history from 06/30/2007 and no changes required. Disable for neuropathy.  Used to work as  Production designer, theatre/television/film at [employer], Never smoked, Socially drinks alcohol, no drugs, decaf coffee.  Likes to play golf and go to gym.  Denies trouble driving.  Married to [place].  Review of Systems      See HPI  Physical Exam  General:  alert, well-developed, well-nourished, and well-hydrated.  obese Head:  normocephalic and atraumatic.   Eyes:  no conjunctival pallor Mouth:  pharynx pink and moist.   Lungs:  Normal respiratory effort, chest expands symmetrically. Lungs are clear to auscultation, no crackles or wheezes. Heart:  Normal rate and regular rhythm. S1 and S2 normal without gallop, murmur, click, rub or other extra sounds. Extremities:  no LE edema Skin:  color normal.  Impression & Recommendations:  Problem # 1:  ANEMIA, IRON DEFICIENCY (ICD-280.9) Secondary to hematochezia which lead to hospitalization in June of this year.  His colonoscopy was + for concentric exudatative inflammation around the anastamoses.  Had a normal EGD.  He is asymptomatic and feeling better iwth iron but Tandem has caused some GI distress.    Will recheck iron levels/ HGB today.  F/U results. change to ferrous sulfate if needing to continue iron. His updated medication list for this problem includes:    Tandem 162-115.2 Mg Caps (Ferrous fum-iron polysacch) .Marland Kitchen... 1 tab by mouth  daily  Orders: T-Hemoglobin ([id number]) T-Hematocrit [phone]) T-Iron [id number]) T-Iron Binding Capacity (TIBC) ([id number]) T-Ferritin ([id number])  Complete Medication List: 1)  Prinivil 20 Mg Tabs (Lisinopril) .... 1/2  tab by mouth daily 2)  Lorazepam Tabs (Lorazepam tabs) .Marland Kitchen.. 1 tab po bid prn anxiety 3)  Metformin Hcl 1000 Mg Tabs (Metformin hcl) .Marland Kitchen.. 1 tab by mouth bid 4)  Zetia 10 Mg Tabs (Ezetimibe) .... Once daily 5)  Levemir Flexpen 100 Unit/ml Soln (Insulin detemir) .... 30 units [place] q am and 30 units [place] q pm 6)  Novolog Flexpen 100 Unit/ml Soln (Insulin aspart) .... Use at mealtime for carb counting/ ssi 7)  Voltaren 1 % Gel (Diclofenac sodium) .... Apply 4 grams qid as needed for lower extremity pain 8)  Tandem 162-115.2 Mg Caps (Ferrous fum-iron polysacch) .Marland Kitchen.. 1 tab by mouth daily  Patient Instructions: 1)  Schedule f/u Diabetes in JAN. 2)  Labs today to recheck iron. 3)  Will call you w/ results next wk. Prescriptions: NOVOLOG FLEXPEN 100 UNIT/ML SOLN (INSULIN ASPART) use at mealtime for carb counting/ SSI  #2 boxes x 3   Entered and Authorized by:   Seymour Bars DO   Signed by:   Seymour Bars DO on 09/02/2010   Method used:   Electronically to        [facility] [address]* (retail)       [address] [place], [place]  [address]       Ph: [phone]       Fax: [phone]   RxID:   [id number]    Orders Added: 1)  T-Hemoglobin [[id number]] 2)  T-Hematocrit [85014-10025] 3)  T-Iron [[id number]] 4)  T-Iron Binding Capacity (TIBC) [[address]] 5)  T-Ferritin [10175-10258] 6)  Est. Patient Level III [52778]

## 2010-12-10 NOTE — Letter (Signed)
Summary: Letters from Patient Regarding Disability Claim  Letters from Patient Regarding Disability Claim   Imported By: Lanelle Bal 02/01/2010 08:43:43  _____________________________________________________________________  External Attachment:    Type:   Image     Comment:   External Document

## 2010-12-10 NOTE — Letter (Signed)
Summary: Physician Notice Letter/Carrier  Physician Notice Letter/[facility]   Imported By: Sherian Rein 09/10/2010 12:20:38  _____________________________________________________________________  External Attachment:    Type:   Image     Comment:   External Document

## 2010-12-10 NOTE — Letter (Signed)
Summary: [facility]  [facility]   Imported By: Lanelle Bal 09/25/2010 12:17:06  _____________________________________________________________________  External Attachment:    Type:   Image     Comment:   External Document

## 2010-12-10 NOTE — Letter (Signed)
Summary: Generic Letter  [facility]  [address], [address]   [place], [place] [address]   Phone: [phone]  Fax: [phone]    02/11/2010  LEWELLYN FULTZ [address] [place], [place]  [address]  To Whom It May Concern,  Mr. Brian Strickland is a primary care patient of mine.  He was diagnosed with peripheral neuropathy in 2007 secondary to both chemotherapeutic effects and diabetes.  He was tried on multiple presciption medications and was referred to specialist.  Unfortunately, Mr. Jurewicz has severe side effects from multiple prescription medications and was unable to continue them.    Over the past year, his neuropathy has progressed up both legs and is present in both hands causing increased functional impairement.  He is unable to do much exercise without having to stop and rest.  He depends more on RX Tramadol for comfort.  He has had increased fatigue and higher blood sugars secondary to decreased mobility.    If you have any questions, please feel free to contact me.         Sincerely,    Seymour Bars DO

## 2010-12-10 NOTE — Assessment & Plan Note (Signed)
Summary: f/u DM/ anemia   Vital Signs:  Patient profile:   65 year old male Height:      73.25 inches Weight:      286 pounds BMI:     37.61 O2 Sat:      99 % on Room air Pulse rate:   82 / minute BP sitting:   116 / 67  (left arm) Cuff size:   large  Vitals Entered By: Payton Spark CMA (June 28, 2010 8:09 AM)  O2 Flow:  Room air CC: F/U DM   Primary Care Provider:  Seymour Bars D.O.  CC:  F/U DM.  History of Present Illness: 65 yo WM presents for f/u DM.  He was admitted to [facility] the end of June for GI bleeding,  EGD/ colonoscopy done thru Dr Madilyn Fireman.  He had antral gastritis and duodenitis.  Nothing new was added.  He saw Dr Madilyn Fireman back.  His Hgb has been stable around 12.  He denies any epigastric or abdominal pain, cramping, melena, hematochezia, anorexis, N/V or bloating.   He is not on a PPI.  His home sugars are running 110-130 AM fasting.  Doing well on current meds.   Denies lows.  UTD with a 6 mos f/u's with Dr Dalene Carrow.    Neuropathy is unchanged.  He wants to restart Voltaren Gel which worked OK for him in the past.     Allergies: 1)  ! Oxycodone Hcl 2)  ! Morphine 3)  ! Nortriptyline Hcl 4)  ! 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 Cholectomy  Colon Cancer 09/1997, recurrence  CABG Lasik eye surgery 2007  Social History: Reviewed history from 06/30/2007 and no changes required. Disable for neuropathy.  Used to work as  Production designer, theatre/television/film at [employer], Never smoked, Socially drinks alcohol, no drugs, decaf coffee.  Likes to play golf and go to gym.  Denies trouble driving.  Married to [place].  Review of Systems      See HPI  Physical Exam  General:  alert,  well-developed, well-nourished, and well-hydrated.  obese Head:  normocephalic and atraumatic.   Eyes:  pupils equal, pupils round, and pupils reactive to light.   Mouth:  pharynx pink and moist.   Neck:  no masses.   Lungs:  Normal respiratory effort, chest expands symmetrically. Lungs are clear to auscultation, no crackles or wheezes. Heart:  Normal rate and regular rhythm. S1 and S2 normal without gallop, murmur, click, rub or other extra sounds. Abdomen:  no epigastric TTP, no AA bruitssoft, non-tender, no distention, no hepatomegaly, and no splenomegaly.  RLQ and LLQ bruising (from insulin) Pulses:  2+ radial pulses Extremities:  no LE edema Neurologic:  gait normal.   Skin:  color normal.   Psych:  good eye contact, not anxious appearing, and not depressed appearing.    Diabetes Management Exam:    Foot Exam (with socks and/or shoes not present):       Sensory-Pinprick/Light touch:          Left medial foot (L-4): absent          Left dorsal foot (L-5): absent          Left lateral foot (S-1): absent  Right medial foot (L-4): absent          Right dorsal foot (L-5): absent          Right lateral foot (S-1): absent       Sensory-Monofilament:          Left foot: absent          Right foot: absent   Impression & Recommendations:  Problem # 1:  DIABETES MELLITUS II, UNCOMPLICATED (ICD-250.00) A1C looks great at 6.7, down from 7.2.  Continue current meds.  work on diet, exercise, wt loss given BMI of 37.   His updated medication list for this problem includes:    Prinivil 20 Mg Tabs (Lisinopril) .Marland Kitchen... 1/2  tab by mouth daily    Metformin Hcl 1000 Mg Tabs (Metformin hcl) .Marland Kitchen... 1 tab by mouth bid    Levemir Flexpen 100 Unit/ml Soln (Insulin detemir) .Marland KitchenMarland KitchenMarland KitchenMarland Kitchen 30 units [place] q am and 30 units [place] q pm    Novolog Flexpen 100 Unit/ml Soln (Insulin aspart) ..... Use at mealtime for carb counting/ ssi  Orders: Fingerstick (36416) Hgb A1C (04540JW) Urine Microalbumin  (11914)  Labs Reviewed: Creat: 1.19 (06/24/2010)   Microalbumin: 10 (06/28/2010)  Last Eye Exam: normal (12/11/2008) Reviewed HgBA1c results: 6.7 (06/28/2010)  7.2 (02/27/2010)  Problem # 2:  BLOOD IN STOOL (ICD-578.1) Resolved.  Had EGD/ colonoscopy 7-1 with Dr Madilyn Fireman while in the hospital for an episode of hematochezia.  This may have been from gastritis (though was asymptmatic) or bleeding from the anastomosis site.  Either way, it has resolved and he is asymptomatic.  His Hgb Mon was 12.1.  I am going to add on an iron level.  Dr Madilyn Fireman did not start him on a PPI but I will hold off since he is asymptomatic.  Problem # 3:  HYPERTENSION, BENIGN SYSTEMIC (ICD-401.1) BP at goal.  Labs UTD.  REnal function did improve.   His updated medication list for this problem includes:    Prinivil 20 Mg Tabs (Lisinopril) .Marland Kitchen... 1/2  tab by mouth daily  BP today: 116/67 Prior BP: 122/65 (02/27/2010)  Prior 10 Yr Risk Heart Disease: N/A (01/21/2008)  Labs Reviewed: K+: 4.7 (06/24/2010) Creat: : 1.19 (06/24/2010)   Chol: 119 (06/24/2010)   HDL: 42 (06/24/2010)   LDL: 59 (06/24/2010)   TG: 88 (06/24/2010)  Problem # 4:  NEUROPATHY, PERIPHERAL (ICD-356.9) Assessment: Unchanged Will try him back on Voltaren gel.  Given max dosing info.  Watch for GI bleeding.  Complete Medication List: 1)  Prinivil 20 Mg Tabs (Lisinopril) .... 1/2  tab by mouth daily 2)  Lorazepam Tabs (Lorazepam tabs) .Marland Kitchen.. 1 tab po bid prn anxiety 3)  Metformin Hcl 1000 Mg Tabs (Metformin hcl) .Marland Kitchen.. 1 tab by mouth bid 4)  Zetia 10 Mg Tabs (Ezetimibe) .... Once daily 5)  Levemir Flexpen 100 Unit/ml Soln (Insulin detemir) .... 30 units [place] q am and 30 units [place] q pm 6)  Novolog Flexpen 100 Unit/ml Soln (Insulin aspart) .... Use at mealtime for carb counting/ ssi 7)  Voltaren 1 % Gel (Diclofenac sodium) .... Apply 4 grams qid as needed for lower extremity pain  Patient Instructions: 1)  A1C improved from 7.2--> 6.7  (great!) 2)  Will  add on iron level to 8-15 labs and will call you w/ results next wk. 3)  Fasting sugar was 185 on labs (high). 4)  Call me and let me know what you are running at home in the next 2 wks. 5)  Return for follow up Anemia/ flu shot in 2 mos. Prescriptions: VOLTAREN 1 % GEL (DICLOFENAC SODIUM) apply 4 grams qid as needed for lower extremity pain  #100 g x 3   Entered and Authorized by:   Seymour Bars DO   Signed by:   Seymour Bars DO on 06/28/2010   Method used:   Electronically to        [facility] (retail)       [address] [place], [place]  [address]       Ph: [phone]       Fax: [phone]   RxID:   [id number]   Laboratory Results   Urine Tests    Microalbumin (urine): 10 mg/L Creatinine: 300mg /dL  A:C Ratio <40  Blood Tests     HGBA1C: 6.7%   (Normal Range: Non-Diabetic - 3-6%   Control Diabetic - 6-8%)

## 2010-12-10 NOTE — Progress Notes (Signed)
Summary: Add some words to the letter that was written [facility]  Phone Note Call from Patient   Caller: Spouse Call For: Brian Strickland Summary of Call: Dr.Kylena Mole      Wife called Brian Strickland     [phone]  Patients wife called and want to know if Dr.Njeri Vicente can add some words to the letter--The letter for [facility], she said his hearing is this Monday. would like to get a call back today.  Initial call taken by: Vanessa [place],  February 14, 2010 9:09 AM  Follow-up for Phone Call        I don't have anything additional to add.   Follow-up by: Seymour Bars DO,  February 14, 2010 9:17 AM     Appended Document: Add some words to the letter that was written for the [facility] informing wife that letter is appropriate and will not be rewritten.   Appended Document: Add some words to the letter that was written for the [facility] Pt's wife called back stating attorney requested adding "buzz" words such as restricted activities due to neuropathy and diabetes. Attorney says these appeals are extremely difficult to win and feels w/out these words Pt does not have a chance. Wife and Pt are very appreciative of what you have done and would greatly appreciate if you added this to the letter.   Appended Document: Add some words to the letter that was written for the [facility] will have to wait till Monday for any changes.  Seymour Bars, D.O.  Appended Document: Add some words to the letter that was written for the [facility] Pt wife aware but Pt's hearing is on Monday.

## 2010-12-12 NOTE — Assessment & Plan Note (Signed)
Summary: sinusitis   Vital Signs:  Patient profile:   65 year old [place] Height:      73.25 inches Weight:      292 pounds BMI:     38.40 O2 Sat:      97 % on Room air Temp:     98.5 degrees F oral Pulse rate:   91 / minute BP sitting:   156 / 73  (left arm) Cuff size:   large  Vitals Entered By: Kathlene November LPN (November 19, 2010 2:58 PM)  O2 Flow:  Room air CC: head congestion, drainage, cough, H./A, sinus pressure, nasal congestion. Symptoms since New Years Eve   Primary Care Provider:  Seymour Bars D.O.  CC:  head congestion, drainage, cough, H./A, sinus pressure, and nasal congestion. Symptoms since New Years Eve.  History of Present Illness: 65 yo WM presents for head congestion which started 12-31.  He has had sore throat, head congestion, rhinorrhea, cough.  Denies chest tightness or SOB.  Denies sputum production.  He has had HAs but no ear pressure.  Denies GI upset.  He had fevers/ chills at the onset but they seem so be coming back.  He has tried Mucinex and Tylenol.    He has had 2 deaths in the family recently and his mom is ill.   He started to feel better for 48 hrs and then suddenly felt worse this morning with sinus pain/ pressure, aches and subjective fevers.  Current Medications (verified): 1)  Prinivil 20 Mg Tabs (Lisinopril) .... 1/2  Tab By Mouth Daily 2)  Lorazepam  Tabs (Lorazepam Tabs) .Marland Kitchen.. 1 Tab Po Bid Prn Anxiety 3)  Metformin Hcl 1000 Mg Tabs (Metformin Hcl) .Marland Kitchen.. 1 Tab By Mouth Bid 4)  Zetia 10 Mg Tabs (Ezetimibe) .... Once Daily 5)  Levemir Flexpen 100 Unit/ml Soln (Insulin Detemir) .... 30 Units [place] Q Am and 30 Units [place] Q Pm 6)  Novolog Flexpen 100 Unit/ml Soln (Insulin Aspart) .... Use At Mealtime For Carb Counting/ Ssi 7)  Voltaren 1 % Gel (Diclofenac Sodium) .... Apply 4 Grams Qid As Needed For Lower Extremity Pain 8)  Tandem 162-115.2 Mg Caps (Ferrous Fum-Iron Polysacch) .Marland Kitchen.. 1 Tab By Mouth Daily  Allergies (verified): 1)  ! Oxycodone Hcl 2)  !  Morphine 3)  ! Nortriptyline Hcl 4)  ! Tegretol  Past History:  Past Medical History: Reviewed history from 06/28/2010 and no changes required. 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 Cholectomy  Colon Cancer 09/1997, recurrence  CABG Lasik eye surgery 2007  Social History: Reviewed history from 06/30/2007 and no changes required. Disable for neuropathy.  Used to work as  Production designer, theatre/television/film at [employer], Never smoked, Socially drinks alcohol, no drugs, decaf coffee.  Likes to play golf and go to gym.  Denies trouble driving.  Married to [place].  Review of Systems      See HPI  Physical Exam  General:  alert, well-developed, well-nourished, and well-hydrated.  obese Head:  normocephalic and atraumatic.  sinuses TTP (maxillary) Eyes:  glassy eyes, conjunctiva clear Ears:  EACs patent; TMs translucent and gray with good cone of light and bony landmarks.  Nose:  nasal congestion with boggy turbinates Mouth:  o/p slightly injected with clear postnasal drip Neck:  no masses.  Lungs:  Normal respiratory effort, chest expands symmetrically. Lungs are clear to auscultation, no crackles or wheezes. Heart:  Normal rate and regular rhythm. S1 and S2 normal without gallop, murmur, click, rub or other extra sounds. Extremities:  no LE edema Skin:  color normal.   Cervical Nodes:  No lymphadenopathy noted   Impression & Recommendations:  Problem # 1:  ACUTE MAXILLARY SINUSITIS (ICD-461.0) Will treat with 10 days of Amoxcillin + Flonase with use of Mucinex and Tylenol as needed.  Rest, clear fluids, supportive care.  Avoid systemic decongestants given HTN/ CAD. Call if not improved after 10 days. His updated medication list for this problem  includes:    Fluticasone Propionate 50 Mcg/act Susp (Fluticasone propionate) .Marland Kitchen... 2 sprays/ nostril daily    Amoxicillin 875 Mg Tabs (Amoxicillin) .Marland Kitchen... 1 tab by mouth two times a day x 10 days  Problem # 2:  ANEMIA, IRON DEFICIENCY (ICD-280.9) He is due for repeat labs and a f/u DM appt in the next 3 wks. His updated medication list for this problem includes:    Tandem 162-115.2 Mg Caps (Ferrous fum-iron polysacch) .Marland Kitchen... 1 tab by mouth daily  Complete Medication List: 1)  Prinivil 20 Mg Tabs (Lisinopril) .... 1/2  tab by mouth daily 2)  Lorazepam Tabs (Lorazepam tabs) .Marland Kitchen.. 1 tab po bid prn anxiety 3)  Metformin Hcl 1000 Mg Tabs (Metformin hcl) .Marland Kitchen.. 1 tab by mouth bid 4)  Zetia 10 Mg Tabs (Ezetimibe) .... Once daily 5)  Levemir Flexpen 100 Unit/ml Soln (Insulin detemir) .... 30 units [place] q am and 30 units [place] q pm 6)  Novolog Flexpen 100 Unit/ml Soln (Insulin aspart) .... Use at mealtime for carb counting/ ssi 7)  Voltaren 1 % Gel (Diclofenac sodium) .... Apply 4 grams qid as needed for lower extremity pain 8)  Tandem 162-115.2 Mg Caps (Ferrous fum-iron polysacch) .Marland Kitchen.. 1 tab by mouth daily 9)  Fluticasone Propionate 50 Mcg/act Susp (Fluticasone propionate) .... 2 sprays/ nostril daily 10)  Amoxicillin 875 Mg Tabs (Amoxicillin) .Marland Kitchen.. 1 tab by mouth two times a day x 10 days  Patient Instructions: 1)  Take 10 days of Amoxicillin for sinus infection. 2)  Add Flonase daily to Mucinex with Tylenol. 3)  Call if not improved in 10 days. 4)  Your next f/u for DM/ iron defic.  due in the next 3 wks. Prescriptions: AMOXICILLIN 875 MG TABS (AMOXICILLIN) 1 tab by mouth two times a day x 10 days  #20 x 0   Entered and Authorized by:   Seymour Bars DO   Signed by:   Seymour Bars DO on 11/19/2010   Method used:   Electronically to        [facility] [address] (retail)       [address] [place], [place]  [address]       Ph: [phone]       Fax: [phone]   RxID:    [id number] FLUTICASONE PROPIONATE 50 MCG/ACT SUSP (FLUTICASONE PROPIONATE) 2 sprays/ nostril daily  #1 bottle x 0   Entered and Authorized by:   Seymour Bars DO   Signed by:   Seymour Bars DO on 11/19/2010   Method used:   Electronically to        [facility] ([facility])       [address] [place], [place]  [address]       Ph: [phone]  Fax: [phone]   RxID:   [id number]    Orders Added: 1)  Est. Patient Level III [43[address]]

## 2011-01-22 ENCOUNTER — Ambulatory Visit (INDEPENDENT_AMBULATORY_CARE_PROVIDER_SITE_OTHER): Payer: BC Managed Care – PPO | Admitting: Family Medicine

## 2011-01-22 ENCOUNTER — Encounter: Payer: Self-pay | Admitting: Family Medicine

## 2011-01-22 DIAGNOSIS — E1149 Type 2 diabetes mellitus with other diabetic neurological complication: Secondary | ICD-10-CM

## 2011-01-22 DIAGNOSIS — D509 Iron deficiency anemia, unspecified: Secondary | ICD-10-CM

## 2011-01-22 DIAGNOSIS — I10 Essential (primary) hypertension: Secondary | ICD-10-CM

## 2011-01-22 DIAGNOSIS — M7130 Other bursal cyst, unspecified site: Secondary | ICD-10-CM | POA: Insufficient documentation

## 2011-01-22 DIAGNOSIS — E119 Type 2 diabetes mellitus without complications: Secondary | ICD-10-CM

## 2011-01-22 LAB — CONVERTED CEMR LAB: Hgb A1c MFr Bld: 7.1 %

## 2011-01-26 LAB — HEMOGLOBIN AND HEMATOCRIT, BLOOD
HCT: 33 % — ABNORMAL LOW (ref 39.0–52.0)
Hemoglobin: 11 g/dL — ABNORMAL LOW (ref 13.0–17.0)

## 2011-01-26 LAB — DIFFERENTIAL
Basophils Relative: 0 % (ref 0–1)
Eosinophils Absolute: 0 10*3/uL (ref 0.0–0.7)
Monocytes Relative: 9 % (ref 3–12)
Neutrophils Relative %: 72 % (ref 43–77)

## 2011-01-26 LAB — BASIC METABOLIC PANEL
BUN: 17 mg/dL (ref 6–23)
Calcium: 9 mg/dL (ref 8.4–10.5)
GFR calc non Af Amer: >60 mL/min (ref >60)
Glucose, Bld: 159 mg/dL — ABNORMAL HIGH (ref 70–99)
Sodium: 139 mEq/L (ref 135–145)

## 2011-01-26 LAB — GLUCOSE, CAPILLARY
Glucose-Capillary: 131 mg/dL — ABNORMAL HIGH (ref 70–99)
Glucose-Capillary: 137 mg/dL — ABNORMAL HIGH (ref 70–99)
Glucose-Capillary: 140 mg/dL — ABNORMAL HIGH (ref 70–99)
Glucose-Capillary: 140 mg/dL — ABNORMAL HIGH (ref 70–99)
Glucose-Capillary: 167 mg/dL — ABNORMAL HIGH (ref 70–99)

## 2011-01-26 LAB — CBC
HCT: 33.4 % — ABNORMAL LOW (ref 39.0–52.0)
HCT: 37.5 % — ABNORMAL LOW (ref 39.0–52.0)
Hemoglobin: 11.9 g/dL — ABNORMAL LOW (ref 13.0–17.0)
Hemoglobin: 12.5 g/dL — ABNORMAL LOW (ref 13.0–17.0)
MCHC: 33.7 g/dL (ref 30.0–36.0)
MCHC: 33.7 g/dL (ref 30.0–36.0)
Platelets: 211 10*3/uL (ref 150–400)
RBC: 4.11 MIL/uL — ABNORMAL LOW (ref 4.22–5.81)
RDW: 14 % (ref 11.5–15.5)
RDW: 14 % (ref 11.5–15.5)
RDW: 14 % (ref 11.5–15.5)
WBC: 5.4 10*3/uL (ref 4.0–10.5)
WBC: 9.2 10*3/uL (ref 4.0–10.5)

## 2011-01-26 LAB — COMPREHENSIVE METABOLIC PANEL
ALT: 22 U/L (ref 0–53)
Alkaline Phosphatase: 50 U/L (ref 39–117)
CO2: 26 mEq/L (ref 19–32)
Chloride: 103 mEq/L (ref 96–112)
Glucose, Bld: 221 mg/dL — ABNORMAL HIGH (ref 70–99)
Potassium: 4.8 mEq/L (ref 3.5–5.1)
Sodium: 138 mEq/L (ref 135–145)
Total Protein: 7.3 g/dL (ref 6.0–8.3)

## 2011-01-26 LAB — CARDIAC PANEL(CRET KIN+CKTOT+MB+TROPI)
CK, MB: 2.5 ng/mL (ref 0.3–4.0)
Relative Index: 1.7 (ref 0.0–2.5)
Total CK: 149 U/L (ref 7–232)
Troponin I: 0.01 ng/mL (ref 0.00–0.06)
Troponin I: 0.01 ng/mL (ref 0.00–0.06)

## 2011-01-26 LAB — TSH: TSH: 1.174 u[IU]/mL (ref 0.350–4.500)

## 2011-01-26 LAB — ABO/RH: ABO/RH(D): O NEG

## 2011-01-26 LAB — MAGNESIUM: Magnesium: 1.9 mg/dL (ref 1.5–2.5)

## 2011-01-26 LAB — CK TOTAL AND CKMB (NOT AT ARMC)
CK, MB: 3.2 ng/mL (ref 0.3–4.0)
Relative Index: 1.9 (ref 0.0–2.5)
Total CK: 167 U/L (ref 7–232)

## 2011-01-26 LAB — TYPE AND SCREEN: Antibody Screen: NEGATIVE

## 2011-01-28 NOTE — Assessment & Plan Note (Signed)
Summary: f/u DM/ BP   Vital Signs:  Patient profile:   65 year old male Height:      73.25 inches Weight:      294 pounds BMI:     38.66 O2 Sat:      98 % on Room air Pulse rate:   75 / minute BP sitting:   143 / 70  (left arm) Cuff size:   large  Vitals Entered By: Payton Spark CMA (January 22, 2011 8:46 AM)  O2 Flow:  Room air CC: F/U DM   Primary Care Provider:  Seymour Bars D.O.  CC:  F/U DM.  History of Present Illness: 65 yo WM presents for f/u visit. He has IDDM, type 2- on Levemir, Metformin and using Novlog for carb counting.  Has fair diet control and has been walking more.  He has gained 2 lbs.    His neuropathy is unchanged.  Denies CP, edema or SOB. [facility] Dr Patsi Sears for his prostate, oncology and GI yearly for hx of colon cancer.  Off iron, denies melena, hematochezia or fatigue.  Has a growing cyst on his R hand that causes pain when gripping the golf club.  Taking his BP meds as directed.  Denies swelling or use of cold meds.    Current Medications (verified): 1)  Prinivil 20 Mg Tabs (Lisinopril) .... 1/2  Tab By Mouth Daily 2)  Lorazepam  Tabs (Lorazepam Tabs) .Marland Kitchen.. 1 Tab Po Bid Prn Anxiety 3)  Metformin Hcl 1000 Mg Tabs (Metformin Hcl) .Marland Kitchen.. 1 Tab By Mouth Bid 4)  Zetia 10 Mg Tabs (Ezetimibe) .... Once Daily 5)  Levemir Flexpen 100 Unit/ml Soln (Insulin Detemir) .... 30 Units [place] Q Am and 30 Units [place] Q Pm 6)  Novolog Flexpen 100 Unit/ml Soln (Insulin Aspart) .... Use At Mealtime For Carb Counting/ Ssi 7)  Voltaren 1 % Gel (Diclofenac Sodium) .... Apply 4 Grams Qid As Needed For Lower Extremity Pain  Allergies (verified): 1)  ! Oxycodone Hcl 2)  ! Morphine 3)  ! Nortriptyline Hcl 4)  ! Tegretol  Past History:  Past Medical History: Reviewed history from 06/28/2010 and no changes required. urethral meatal stenosis s/p dilatation (Dr Ruthe Mannan) CEA 1.5 2008 colon cancer, recurrent -- [facility] T2DM peripheral neuropathy  secondary to DM and chemotherapy  cards: Dr [facility] -- Madilyn Fireman  Past Surgical History: Reviewed history from 01/21/2008 and no changes required. Cholectomy  Colon Cancer 09/1997, recurrence  CABG Lasik eye surgery 2007  Social History: Reviewed history from 06/30/2007 and no changes required. Disable for neuropathy.  Used to work as  Production designer, theatre/television/film at [employer], Never smoked, Socially drinks alcohol, no drugs, decaf coffee.  Likes to play golf and go to gym.  Denies trouble driving.  Married to [place].  Review of Systems      See HPI  Physical Exam  General:  alert, well-developed, well-nourished, and well-hydrated.  obese Head:  normocephalic and atraumatic.   Eyes:  pupils equal, pupils round, and pupils reactive to light.   Mouth:  pharynx pink and moist.   Neck:  no masses.   Lungs:  Normal respiratory effort, chest expands symmetrically. Lungs are clear to auscultation, no crackles or wheezes. Heart:  Normal rate and regular rhythm. S1 and S2 normal without gallop, murmur, click, rub or other extra sounds. Extremities:  trace LE edema bilat Skin:  color normal.   Cervical Nodes:  No lymphadenopathy noted Psych:  good eye contact, not anxious  appearing, and not depressed appearing.     Impression & Recommendations:  Problem # 1:  DIABETES MELLITUS II, UNCOMPLICATED (ICD-250.00) A1c 7.1= good.  Labs UTD.  [facility].  Has complication of neuropathy, stable. Stay on current meds.  BMI 38.  Needs to work on diet, exercise, wt loss. His updated medication list for this problem includes:    Prinivil 20 Mg Tabs (Lisinopril) .Marland Kitchen... 1/2  tab by mouth daily    Metformin Hcl 1000 Mg Tabs (Metformin hcl) .Marland Kitchen... 1 tab by mouth bid    Levemir Flexpen 100 Unit/ml Soln (Insulin detemir) .Marland KitchenMarland KitchenMarland KitchenMarland Kitchen 30 units [place] q am and 30 units [place] q pm    Novolog Flexpen 100 Unit/ml Soln (Insulin aspart) ..... Use at mealtime for carb counting/ ssi  Orders: Fingerstick  (21308) Hemoglobin A1C (83036)  Labs Reviewed: Creat: 1.19 (06/24/2010)   Microalbumin: 10 (06/28/2010)  Last Eye Exam: normal (12/11/2008) Reviewed HgBA1c results: 7.1 (01/22/2011)  6.9 (08/05/2010)  Problem # 2:  HYPERTENSION, BENIGN SYSTEMIC (ICD-401.1) Assessment: Deteriorated Bp goal is <130/80.  Since he has been consistently above this and has hx of CAD, will add Atenolol once daily. F/U in 3-4 mos. His updated medication list for this problem includes:    Prinivil 20 Mg Tabs (Lisinopril) .Marland Kitchen... 1/2  tab by mouth daily    Atenolol 25 Mg Tabs (Atenolol) .Marland Kitchen... 1 tab by mouth once daily  BP today: 143/70 Prior BP: 156/73 (11/19/2010)  Prior 10 Yr Risk Heart Disease: N/A (01/21/2008)  Labs Reviewed: K+: 4.7 (06/24/2010) Creat: : 1.19 (06/24/2010)   Chol: 119 (06/24/2010)   HDL: 42 (06/24/2010)   LDL: 59 (06/24/2010)   TG: 88 (06/24/2010)  Problem # 3:  HYPERCHOLESTEROLEMIA (ICD-272.0)  His updated medication list for this problem includes:    Zetia 10 Mg Tabs (Ezetimibe) ..... Once daily  Labs Reviewed: SGOT: 20 (06/24/2010)   SGPT: 19 (06/24/2010)  Lipid Goals: Chol Goal: 200 (01/21/2008)   HDL Goal: 40 (01/21/2008)   LDL Goal: 70 (01/21/2008)   TG Goal: 150 (01/21/2008)  Prior 10 Yr Risk Heart Disease: N/A (01/21/2008)   HDL:42 (06/24/2010), 38 (02/29/2008)  LDL:59 (06/24/2010), 68 (02/29/2008)  Chol:119 (06/24/2010), 134 (02/29/2008)  Trig:88 (06/24/2010), 140 (02/29/2008)  Problem # 4:  ANEMIA, IRON DEFICIENCY (ICD-280.9) Off iron and feeling well.  Sees oncology and GI every year.  Had hematochezia last year (cause of his iron def) from inflammation around anastomosis site of his colectomy. The following medications were removed from the medication list:    Tandem 162-115.2 Mg Caps (Ferrous fum-iron polysacch) .Marland Kitchen... 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 Orders: Sports Medicine  (Sports Med)  Complete Medication List: 1)  Prinivil 20 Mg Tabs (Lisinopril) .... 1/2  tab by mouth daily 2)  Lorazepam Tabs (Lorazepam tabs) .Marland Kitchen.. 1 tab po bid prn anxiety 3)  Metformin Hcl 1000 Mg Tabs (Metformin hcl) .Marland Kitchen.. 1 tab by mouth bid 4)  Zetia 10 Mg Tabs (Ezetimibe) .... Once daily 5)  Levemir Flexpen 100 Unit/ml Soln (Insulin detemir) .... 30 units [place] q am and 30 units [place] q pm 6)  Novolog Flexpen 100 Unit/ml Soln (Insulin aspart) .... Use at mealtime for carb counting/ ssi 7)  Voltaren 1 % Gel (Diclofenac sodium) .... Apply 4 grams qid as needed for lower extremity pain 8)  Ciprofloxacin Hcl 500 Mg Tabs (Ciprofloxacin hcl) .Marland KitchenMarland KitchenMarland Kitchen 1  tab by mouth two times a day x 7 days 9)  Atenolol 25 Mg Tabs (Atenolol) .Marland Kitchen.. 1 tab by mouth once daily  Patient Instructions: 1)  Add Atenolol once daily for BP. 2)  Keep up the good work. 3)  REturn for f/u diabetes/ BP in 3-4 mos. Prescriptions: ATENOLOL 25 MG TABS (ATENOLOL) 1 tab by mouth once daily  #30 x 3   Entered and Authorized by:   Seymour Bars DO   Signed by:   Seymour Bars DO on 01/22/2011   Method used:   Electronically to        [facility] [address] (retail)       [address] [place], [place]  [address]       Ph: [phone]       Fax: [phone]   RxID:   [id number] CIPROFLOXACIN HCL 500 MG TABS (CIPROFLOXACIN HCL) 1 tab by mouth two times a day x 7 days  #14 x 2   Entered and Authorized by:   Seymour Bars DO   Signed by:   Seymour Bars DO on 01/22/2011   Method used:   Electronically to        [facility] [address] (retail)       [address] [place], [place]  [address]       Ph: [phone]       Fax: [phone]   RxID:   [id number]    Orders Added: 1)  Fingerstick [36416] 2)  Hemoglobin A1C [83036] 3)  Sports Medicine [Sports Med] 4)  Est. Patient Level IV [60630]    Laboratory Results   Blood Tests     HGBA1C: 7.1%   (Normal Range: Non-Diabetic - 3-6%   Control Diabetic - 6-8%)

## 2011-02-16 LAB — BASIC METABOLIC PANEL
Calcium: 10.1 mg/dL (ref 8.4–10.5)
Creatinine, Ser: 1.2 mg/dL (ref 0.4–1.5)
GFR calc Af Amer: >60 mL/min (ref >60)

## 2011-02-16 LAB — CBC
MCHC: 33.7 g/dL (ref 30.0–36.0)
RBC: 4.84 MIL/uL (ref 4.22–5.81)
WBC: 8.5 10*3/uL (ref 4.0–10.5)

## 2011-02-16 LAB — DIFFERENTIAL
Basophils Relative: 1 % (ref 0–1)
Lymphs Abs: 3.7 10*3/uL (ref 0.7–4.0)
Monocytes Relative: 11 % (ref 3–12)
Neutro Abs: 3.6 10*3/uL (ref 1.7–7.7)
Neutrophils Relative %: 42 % — ABNORMAL LOW (ref 43–77)

## 2011-02-16 LAB — POCT CARDIAC MARKERS
CKMB, poc: 1.8 ng/mL (ref 1.0–8.0)
Myoglobin, poc: 67.7 ng/mL (ref 12–200)
Troponin i, poc: <0.05 ng/mL (ref 0.00–0.09)

## 2011-03-25 NOTE — Assessment & Plan Note (Signed)
Mr. Goldsborough returns today.  He is a 65 year old male.  Last visit was  December.  He has a history of primary sensory polyneuropathy affecting  proprioception, light touch and sharp touch.  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  In addition, he has left cubital tunnel syndrome.  None of these  problems have appreciably worsened.  He is doing overall quite well.  In  terms of his functional status, still is doing some exercises.  He  indicates that his diabetic control is good.  His average pain is 4/10  and interferes with activity at 6/10.  Sleep is fair.  Pain is worse  with walking and standing, mainly in the feet.  Rest of the medication  help.   PHYSICAL EXAMINATION:  VITAL SIGNS:  His blood pressure is 95/62, weight  283 pounds.  GENERAL:  No acute distress.  EXTREMITIES:  His motor strength is 5/5, bilateral deltoid biceps grip  as well as hip flexion, knee extension, and 4/10 in the ankle  dorsiflexion and plantar flexion.  Sensation reduced in a stocking  fashion as well as a glove fashion, although he does have some normal  sensation at the wrist.   Proprioception is only minimally reduced in lower extremities.   IMPRESSION:  Polyneuropathy multifactorial both chemo and diabetes.  He  is stable on the current medication regimen by tramadol 50 mg p.o.  t.i.d.  He gets his medications through the [place].  [facility]  as well as his family practice doctor, and at this point, I do not feel  that I have anything else to add.  I will see him back on a p.r.n.  basis.  I discussed with the patient agrees with plan.      Erick Colace, M.D.  Electronically Signed     AEK/MedQ  D:  05/18/2009 10:00:39  T:  05/18/2009 23:52:42  Job #:  [id number]   cc:   Seymour Bars, D.O.  [facility].  [address]  [place], [place] [address]

## 2011-03-25 NOTE — Assessment & Plan Note (Signed)
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  Continues to have bilateral hand and foot  numbness, tingling.  Overall controlled with Ultram except when he is  doing walking.  He can only walk about a quarter mile at a time.  On the  gym he used to do about two miles at a time, and it is the foot pain  that bothers him when he is up on his feet a lot.  His interval medical  history is otherwise negative.  His colon cancer appears to be in  remission.  He has had a surveillance CT of the abdomen this week and  plans a followup with Dr. Dalene Carrow this week.   His diabetic control is okay.  His last hemoglobin A1C was about 7.   PHYSICAL EXAMINATION:  His blood pressure is 120/74, pulse 92, weight  297 pounds.  GENERAL:  No acute distress.  Mood and affect appropriate.  GAIT:  Shows no evidence of toe drag or knee instability.  However, he  is unable to heel walk as well on the left side compared to the right  side.  He is able to toe walk.  STRENGTH:  His strength is 5/5 bilat al deltoids, biceps, triceps, grip.  Knee extension and right ankle dorsiflexion, left ankle dorsiflexion is  4/5.  SENSATION:  Reduced on the dorsum of the foot bilaterally to both pin  prick and light tough, and extends into the medial aspect of the leg.  His lateral aspect of the leg has diminished sensation, but is able to  identify at least sharp touch on that side.  His proprioception is  intact on the left and mildly diminished on the right at the great toe.   IMPRESSION:  1. 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  2. 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  3. Left ulnar neuropathy, not particularly problematic for him in      terms of left upper.   PLAN:  1. In terms of his diminished exercise tolerance related to his      neuropathic pain, we will  trial him on Cymbalta 30 mg daily.  I      will see him back in one month and may need to increase it to 60.      I have warned him about the potential side effects including      depression.  Discussed serotonin syndrome as this relates to      combination of this and Tramadol.  Discussed nausea that usually      resolves in about one week's time.  We have already trialed Lyrica      and Neurontin, and both of these cause excessive drowsiness.  2. Continue Ultram 1-2 p.o. b.i.d., maximum of 4 per day.  3. I will see him back in one month followup, see how he is doing with      his neuropathic pain, and monitor side effects.  He is to call if      he has any side effects that do not go away after a week or so or      are severe.      Erick Colace, M.D.  Electronically Signed     AEK/MedQ  D:  01/21/2008 08:58:30  T:  01/21/2008 13:58:37  Job #:  [id number]   cc:  Seymour Bars, D.O.  [facility].  [address]  [place], [place] [address]   Lauretta I. Odogwu, M.D.  Fax: [phone]

## 2011-03-25 NOTE — Assessment & Plan Note (Signed)
HISTORY OF PRESENT ILLNESS:  A 65 year old male with sensory neuropathy  secondary to oxaliplatin as well as mixed sensory motor neuropathy  secondary to diabetes and a more focal left ulnar neuropathy.  Continues  to have bilateral hand and foot numbness, tingling.  His pain is overall  well-controlled with Ultram except at night.  He has continued to work  out during the day, does his physical therapy exercises.  His main  difference in the last several months since I saw him in May was that he  has some increasing problems with his balance and has been installing  additional night lights.  He burned his hand a couple times while trying  to cook.   His diabetic control is reported as good.  He states his hemoglobin A1c  is around 7.  He has had no new medical complications since I last saw  him.  His pain is 4 to 6 out 10, worse toward the evening hours.  Relief  from meds is fair.  He can walk 15 minutes at a time and climb steps.  He drives.   OBJECTIVE:  Proprioception reduced, bilateral great toes.  Sensation  reduced below the ankle and the lower extremities intact and the upper  extremities.  His gait is wide based.  No evidence of toe drag.  He is  unable to do heel walk on left foot.  His foot drops.  He is able to do  toe walk bilaterally.   IMPRESSION:  1. Sensory motor neuropathy, diabetes - this would explain some      development of some foot drop on the left.  No back pain to      indicate a radiculopathy, although this could still be in the      differential.  2. 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  This may still      improve over time, however, he has had overall a worsening of his      proprioception which I would attribute to the diabetes.   PLAN:  I will see him back in 3 months.  Continue the Ultram 1 p.o.  b.i.d. and 2 q.h.s.  He will follow up with Dr. Cathey Endow for his diabetes  management.  He may need another round of physical therapy should his  balance get worse or maybe start using a cane.      Erick Colace, M.D.  Electronically Signed     AEK/MedQ  D:  07/02/2007 10:43:30  T:  07/03/2007 12:08:52  Job #:  [id number]   cc:   Seymour Bars, D.O.  [facility].  [address] [place] [address]  [place], [place] [address]

## 2011-03-25 NOTE — Assessment & Plan Note (Signed)
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  Continues to have foot numbness and  tingling greater than hand and arm.  He has had some problems with back  pain.  Since I last saw him, he tried to catch his wife as she was  falling, and he twisted his back.  His pain more or less comes and goes,  but responds to Tylenol plus heat.   His diabetic control is okay at this point.  He has had no new medical  complications, other than listed above.   His pain level is 5/10 to 6/10 and interferes with activity at a  moderate level.  Sleep is good.  He can walk 20 minutes at a time.  He  can climb steps.  He drives.   REVIEW OF SYSTEMS:  Positive for tingling in the feet.   He is followed up with Dr. Dalene Carrow from oncology for his colon carcinoma.   Other past medical history, high blood pressure, controlled.   EXAMINATION:  Blood pressure 112/76, pulse 82, weight 297 pounds, which  is actually a weight loss.  Examination reveals decreased sensation below the ankle on the left side  and below the knee on the right side.  He is able to take a couple steps  on the left foot before he has foot drop.  He is able to do toe walk.  His gait is wide-based.  His strength in the upper extremities is 5/5 in  the deltoids, biceps, triceps, grip, and in the knee extension is 5/5 as  well.  Ankle dorsiflexion on the left 4+/5, right 5/5.   IMPRESSION:  1. Sensory and motor neuropathy, diabetes, left foot drop.  No      progression, in fact may be a bit better.  He does have some low      back pain, but no clear signs of radiculopathy.  2. 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  3. Left ulnar neuropathy.   PLAN:  1. Continue Ultram 1 p.o. b.i.d. and 2 nightly.  2. Continue with primary care for diabetes management as this should      help with progression of diabetic  neuropathy.   No need for further intervention in terms of his back.  I will see him  back in 3 months.      Erick Colace, M.D.  Electronically Signed     AEK/MedQ  D:  10/01/2007 09:23:58  T:  10/01/2007 13:56:21  Job #:  [id number]   cc:   Seymour Bars, D.O.  [facility].  [address]  [place], [place] [address]   Lauretta I. Odogwu, M.D.  Fax: [phone]

## 2011-03-25 NOTE — Assessment & Plan Note (Signed)
Mr. Brian Strickland returns today.  He was last seen by me May 26, 2008.  He has  a history of polyneuropathy related to oxaliplatin with a concomitant  diabetic polyneuropathy.  In addition, he has left cubital tunnel  syndrome.   He has had some increased problems with standing tolerance.  No  nighttime difficulties.  He is using tramadol and takes 2 p.o. t.i.d.   He is wondering if there is anything else topical that may be helpful  for his neuropathic pain symptoms.  These are mainly affecting his feet  in terms of limiting him.   PHYSICAL EXAMINATION:  GENERAL:  In no acute distress.  He is obese.  His weight is 285 pounds, but this is actually down from 293 back in  July.   His average pain is 6-7/10, interferes with activity at a 7/10 level.  His pain is worse toward the end of the day, but does not bother him at  night.  His relief from meds is fair.  He can walk 20 minutes at a time.   He has had no new medical problems.  He is being treated for hypotension  as well as diabetes.   VITAL SIGNS:  His blood pressure is 111/71, pulse 93, and weight 285  pounds.   NEUROLOGIC:  Orientation x3.  Affect is alert.  Gait is slightly wide  based, but no evidence of toe drag or knee instability.  His Romberg is  mildly positive.  He can close his eyes for about 10 seconds before he  starts leaning forward.   He has no ataxia in the limbs.   MUSCULOSKELETAL:  He has good strength bilateral upper and lower  extremity.  He is still able to toe walk and heel walk as well.  His  sensation is reduced to sharp touch bilateral fingers and toes, and is  worse on the right side in the lower extremity than on the left side.  He has reduced proprioception on the right side in fact it is absent and  impaired on the left side.   IMPRESSION:  Primarily sensory polyneuropathy affecting proprioception,  light touch, and sharp touch.   PLAN:  1. Given that his pain is mainly during the day with  activity and      standing, continue tramadol 2 p.o. t.i.d.  2. Voltaren gel apply to feet q.i.d. even though this is nonsteroidal      antiinflammatory, and has been found to be useful in peripheral      neuropathy as well.  Given his weight, I would not be surprised to      see that some of his neuropathic pain in his feet could be      musculoskeletal as well.  He is already wearing nicely padded      shoes.  I do not think any orthotics would be needed at this time.      He does not have any obvious foot deformities.  Skin looks good.   I will see him back in 6 months.  If he fails the Voltaren, I can see  him back sooner and look at other treatment options including narcotic  analgesics.  He may be a good candidate for Nucynta given that it is  similar to tramadol, but a bit stronger.      Erick Colace, M.D.  Electronically Signed     AEK/MedQ  D:  10/13/2008 08:37:06  T:  10/13/2008 21:04:12  Job #:  [id number]  cc:   Seymour Bars, D.O.  [facility].  [address]  [place], [place] [address]

## 2011-03-25 NOTE — Assessment & Plan Note (Signed)
Brian Strickland returns today.  He is a 65 year old male who I last saw in  March 2009.  He has a history of sensory neuropathy due to oxaliplatin  as well as some neck sensory motor neuropathy due to diabetes as well as  left focal ulnar neuropathy.  He has had some increase of his foot pain.  He also has symptoms where he feels like his toes are crossing mainly at  nighttime.  His Oswestry was performed today.  His walking distance is  about a quarter mile.  Pain, sitting tolerance about 1 hour, standing  tolerance about 10 minutes, and he has some social life restrictions.  His overall Oswestry is scored as 35.5 with placing him in the moderate  disability range.   He thinks that he has reduced some of his activities lately but  continues such as golf.  He has tried to continue his exercise program,  however.  He has had no falls.  He does have some increase in his foot  pain with activity.  His pain is described as burning, tingling,  interferes with activity at 7/10 level.  Sleep is fair.  Pain is worse  with walking, relief from meds is 3/10.  He gets his meds through the  [place], Tramadol 50 mg t.i.d.   Last visit, he tried some Cymbalta but this caused excessive diarrhea,  and he stopped it after a couple days.   PHYSICAL EXAMINATION:  VITAL SIGNS:  His blood pressure is 103/66, pulse  of 82, and weight is 293 pounds.  The strength is 5/5 in bilateral deltoid, biceps, triceps, grip.  Knee  extension, ankle dorsiflexion are 5/5.  His left ankle dorsiflexion is  about 4+/5.  His sensation is reduced below the ankle in bilateral feet.  He has decreased sharp dull discrimination in the hands distal to the  wrist.   Gait shows no evidence of  toe drag or knee instability.  He is able to  toe-walk and heel-walk, short distance, and has some balance problems  keeping with this.   IMPRESSION:  1. 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  2.  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  3. Left ulnar neuropathy, although this is not particularly      symptomatic.   PLAN:  We will increase on his Ultram 50 mg 2 p.o. t.i.d.   We have trialed other medications such as Cymbalta, Neurontin, Lyrica,  and he is not able to tolerate these for a couple different reasons.  He  has also been trialed on amitriptyline.   I will see him back in about 2 months to see how he does with the  increased dosing.  I have indicated that the lower extremity cramping  symptoms are likely neuropathic pain symptoms rather than a dystonia,  but we will continue to monitor.  Certainly, nothing to suggest any  spasticity.      Brian Strickland, M.D.  Electronically Signed    AEK/MedQ  D:  05/26/2008 10:07:51  T:  05/27/2008 02:22:12  Job #:  [id number]   cc:   Brian Strickland, D.O.  [facility].  [address]  [place], [place] [address]

## 2011-03-28 NOTE — Op Note (Signed)
Brian Strickland, Brian Strickland               ACCOUNT NO.:  [id number]   MEDICAL RECORD NO.:  [id number]          PATIENT TYPE:  AMB   LOCATION:  DSC                          FACILITY:  [facility]   PHYSICIAN:  Currie Paris, M.D.DATE OF BIRTH:  05/10/1946   DATE OF PROCEDURE:  11/13/2006  DATE OF DISCHARGE:                               OPERATIVE REPORT   PREOPERATIVE DIAGNOSIS:  Unneeded port-A-Cath.   POSTOPERATIVE DIAGNOSIS:  Unneeded port-A-Cath.   OPERATION:  Removal of port-A-Cath.   CLINICAL HISTORY:  Mr. Laban is a 65 year old gentleman who has a port-  A-Cath in that is no longer needed.  He wished to have this removed.   DESCRIPTION OF PROCEDURE:  The patient was seen the minor procedure  room, and we confirmed port-A-Cath removal as the planned procedure.   The area over the port-A-Cath was identified, and the time-out occurred.   The area was then prepped with some alcohol and anesthetized with 1%  Xylocaine with epinephrine using 10 cc.  I waited 10 minutes, and then  the area was reprepped with Betadine.  The old scar was opened.  The  port-A-Cath was identified and the capsule around it opened.  The  reservoir was backed out into the wound.  I put a suture of 3-0 Vicryl  around the port-A-Cath tubing, and the tubing was then backed out and  came out intact.  The suture was tied down, and there was no  backbleeding.   Incision was closed in layers with 3-0 Vicryl, 4-0 Monocryl  subcuticular, and Dermabond.   The patient tolerated the procedure well, and there were no  complications.      Currie Paris, M.D.  Electronically Signed     CJS/MEDQ  D:  11/13/2006  T:  11/13/2006  Job:  [id number]

## 2011-03-28 NOTE — Discharge Summary (Signed)
NAMEREYAAN, THOMA               ACCOUNT NO.:  [id number]   MEDICAL RECORD NO.:  [id number]          PATIENT TYPE:  INP   LOCATION:  1617                         FACILITY:  [facility]   PHYSICIAN:  Currie Paris, M.D.DATE OF BIRTH:  1946/01/30   DATE OF ADMISSION:  07/31/2005  DATE OF DISCHARGE:  08/05/2005                                 DISCHARGE SUMMARY   FINAL DIAGNOSES:  1.  Malignant neoplasm left transverse colon.  2.  Malignant neoplasm right transverse colon.  3.  Type 2 diabetes.  4.  History of coronary artery disease.  5.  Hypertension.  6.  Morbid obesity.   CLINICAL HISTORY:  Mr. Blatz is a gentleman had a right colectomy about 7  or 8  years ago for an ascending colon cancer and has done well. A recent  colonoscopy unfortunately showed a new cancer in what was supposed to be the  proximal descending colon. He was scheduled for partial colectomy. We  discussed the possibility of doing a subtotal colectomy because of his now  history of two colon cancers.   At the time of surgery, his transverse colon tumor was actually in the  middle to left transverse colon not the proximal descending colon was not  found until after we had done a left hemicolectomy from about the mid distal  left transverse down to the junction of the distal sigmoid. Because of the  finding of a close margin and the fact that I thought to get adequate  lymphatic tissue, we needed to go ahead and resect him back down to his  origin of his middle colics. I had to go head and free up the entire right  transverse colon and his old and anastomosis and we resected the old  anastomosis plus the residual right transverse colon. A primary anastomosis  was done.   The patient tolerated procedure very nicely. The next morning he was having  very little pain, just a little bit sleepy. CBGs were slightly elevated and  we stopped the glucose and his IV fluids and he did well on sliding-scale  insulin. He  remained afebrile the next several days. He was having a little  difficulty sleeping. By the fourth postop day, his bowels were moving and  started liquids and advanced. On the fifth postop day, he wanted to go home,  he was having no pain, he slept well, his bowels were working, his abdomen  was benign. At that point, he was felt okay to be discharged and is  discharged to follow-up in my office in approximately 10 days. Preoperative  hemoglobin was 15, postop 11.4. White count preop 6.4, 10.7 postop on  October 23. Electrolytes were basically normal. He was O negative blood  type. Chest x-ray did show some mild chronic cardiomegaly but no active  disease.   Pathology report showed that this was indeed twp cancers, one in the left  transverse which was the one seen on colonoscopy and a second one in the  right transverse that was unexpected. Both were resected. He was a T4 N0.  There were actually  very few lymph nodes resected despite taking out all the  mesentery and I suspect a lot of his proximal nodes had been taken at the  time of his prior right colectomy.      Currie Paris, M.D.  Electronically Signed     CJS/MEDQ  D:  09/10/2005  T:  09/11/2005  Job:  [id number]

## 2011-03-28 NOTE — Procedures (Signed)
[facility]  Patient:    Brian Strickland, SURGEON Visit Number: [id number] MRN: [id number]          Service Type: OUT Location: ENDO Attending Physician:  Louie Bun Dictated by:   Everardo All Madilyn Fireman, M.D. Proc. Date: 03/07/02 Admit Date:  03/07/2002   CC:         Lowell C. Catha Gosselin, M.D.  Currie Paris, M.D.  Chales Salmon. Abigail Miyamoto, M.D.   Procedure Report  PROCEDURE:  Colonoscopy.  INDICATION FOR PROCEDURE:  History of colon cancer, status post right hemicolectomy 1998, due for endoscopic surveillance.  DESCRIPTION OF PROCEDURE:  The patient was placed in the left lateral decubitus position and placed on the pulse monitor with continuous low flow oxygen delivered via nasal cannula. He was sedated with 100 mg IV Demerol and 10 mg IV Versed. The Olympus video colonoscope was inserted into the rectum and advanced to the ileocolonic anastomosis, which was easily identified with terminal ileal mucosa explored for several centimeters. There was no suggestion of neoplasm at the anastomosis. The remaining proximal colon appeared normal as did the transverse, descending, sigmoid and rectum, with no masses, polyps, diverticula, or other mucosal abnormalities. Retroflex view of the anus revealed no obvious internal hemorrhoids. The colonoscope was then withdrawn and the patient returned to the recovery room in stable condition. He tolerated the procedure well and there were no immediate complications.  IMPRESSION:  Normal colonoscopy status post right hemicolectomy.  PLAN:  Repeat colonoscopy in five years. Dictated by:   Everardo All Madilyn Fireman, M.D. Attending Physician:  Louie Bun DD:  03/07/02 TD:  03/07/02 Job: [id number]

## 2011-04-15 ENCOUNTER — Other Ambulatory Visit: Payer: Self-pay | Admitting: *Deleted

## 2011-04-15 MED ORDER — LORAZEPAM 1 MG PO TABS: 1.0000 mg | ORAL_TABLET | Freq: Two times a day (BID) | ORAL | Status: DC | PRN

## 2011-04-15 MED ORDER — TRAMADOL HCL 50 MG PO TABS: ORAL_TABLET | ORAL | Status: DC

## 2011-05-11 ENCOUNTER — Encounter: Payer: Self-pay | Admitting: Family Medicine

## 2011-05-13 ENCOUNTER — Ambulatory Visit (INDEPENDENT_AMBULATORY_CARE_PROVIDER_SITE_OTHER): Payer: BC Managed Care – PPO | Admitting: Family Medicine

## 2011-05-13 ENCOUNTER — Encounter: Payer: Self-pay | Admitting: Family Medicine

## 2011-05-13 VITALS — BP 113/72 | HR 88 | Ht 74.0 in | Wt 290.0 lb

## 2011-05-13 DIAGNOSIS — I10 Essential (primary) hypertension: Secondary | ICD-10-CM

## 2011-05-13 DIAGNOSIS — E1149 Type 2 diabetes mellitus with other diabetic neurological complication: Secondary | ICD-10-CM

## 2011-05-13 DIAGNOSIS — E119 Type 2 diabetes mellitus without complications: Secondary | ICD-10-CM

## 2011-05-13 LAB — POCT GLYCOSYLATED HEMOGLOBIN (HGB A1C): Hemoglobin A1C: 7.1

## 2011-05-13 MED ORDER — LORAZEPAM 1 MG PO TABS: 1.0000 mg | ORAL_TABLET | Freq: Two times a day (BID) | ORAL | Status: DC | PRN

## 2011-05-13 NOTE — Assessment & Plan Note (Signed)
A1c good at 7.1.  Continue current meds.  Needs to work on diet/ exercise/ wt loss.  Eye exam/ labs are UTd.  Urine micro is UTD.  Neuropathy unchanged.

## 2011-05-13 NOTE — Progress Notes (Signed)
  Subjective:    Patient ID: Brian Strickland, male    DOB: [birth year], 65 y.o.   MRN: [id number]  HPI 65 yo obese WM with IDDM present for f/u visit.  His A1C is good today at 7.1.  Doing well on current meds.  AM fastings are running in the 130s.  He has had a couple lows -- but not really.  These are 85s.  We added Atenolol  Last visit for his BP which is working well and he is tolerating well.  He sees the [place] for eye exams.  Goes back in Aug for cardiology appt.  Denies chest pain or SOB.  He is still going to the gym.  He is   Continues to gain weight. Exercise limited by peripheral neuropathy tannenbaum for urology care.  Seeing oncology for f/u colon cancer.  BP 113/72  Pulse 88  Ht 6\' 2"  (1.88 m)  Wt 290 lb (131.543 kg)  BMI 37.23 kg/m2  SpO2 97%   Review of Systems  Constitutional: Negative for fatigue.  Eyes: Negative for visual disturbance.  Respiratory: Negative for shortness of breath.   Cardiovascular: Negative for chest pain.  Genitourinary: Negative for difficulty urinating.  Neurological: Positive for numbness.       Objective:   Physical Exam  Constitutional: He appears well-developed and well-nourished.       obese  HENT:  Mouth/Throat: Oropharynx is clear and moist.  Eyes: Conjunctivae are normal.  Neck: Neck supple. No thyromegaly present.  Cardiovascular: Normal rate, regular rhythm and normal heart sounds.   Pulmonary/Chest: Effort normal and breath sounds normal.  Musculoskeletal: He exhibits no edema.  Lymphadenopathy:    He has no cervical adenopathy.  Psychiatric: He has a normal mood and affect.          Assessment & Plan:

## 2011-05-13 NOTE — Assessment & Plan Note (Addendum)
BP perfect by adding Atenolol last visit.  Continue current meds.  Has cardiology f/u this summer.

## 2011-05-13 NOTE — Patient Instructions (Signed)
Stay on current meds.  A1C good at 7.1.  BP looks great.  Work on Engineer, manufacturing weight loss.  Fax copy of labs from the [place] or drop off ( due in mid August).  Return for f/u diabetes in 4 mos.

## 2011-05-20 ENCOUNTER — Other Ambulatory Visit: Payer: Self-pay | Admitting: Hematology and Oncology

## 2011-05-20 ENCOUNTER — Encounter (HOSPITAL_BASED_OUTPATIENT_CLINIC_OR_DEPARTMENT_OTHER): Payer: BC Managed Care – PPO | Admitting: Hematology and Oncology

## 2011-05-20 DIAGNOSIS — C189 Malignant neoplasm of colon, unspecified: Secondary | ICD-10-CM

## 2011-05-20 LAB — COMPREHENSIVE METABOLIC PANEL
ALT: 27 U/L (ref 0–53)
CO2: 20 mEq/L (ref 19–32)
Calcium: 9.7 mg/dL (ref 8.4–10.5)
Chloride: 104 mEq/L (ref 96–112)
Potassium: 4.8 mEq/L (ref 3.5–5.3)
Sodium: 135 mEq/L (ref 135–145)
Total Protein: 7 g/dL (ref 6.0–8.3)

## 2011-05-20 LAB — CBC WITH DIFFERENTIAL/PLATELET
BASO%: 0.6 % (ref 0.0–2.0)
HCT: 44 % (ref 38.4–49.9)
MCHC: 34.7 g/dL (ref 32.0–36.0)
MONO#: 0.6 10*3/uL (ref 0.1–0.9)
RBC: 4.67 10*6/uL (ref 4.20–5.82)
RDW: 13.6 % (ref 11.0–14.6)
WBC: 5.6 10*3/uL (ref 4.0–10.3)
lymph#: 1.9 10*3/uL (ref 0.9–3.3)

## 2011-05-20 LAB — CEA: CEA: 1.4 ng/mL (ref 0.0–5.0)

## 2011-05-27 ENCOUNTER — Other Ambulatory Visit: Payer: Self-pay | Admitting: Hematology and Oncology

## 2011-05-27 ENCOUNTER — Encounter (HOSPITAL_BASED_OUTPATIENT_CLINIC_OR_DEPARTMENT_OTHER): Payer: Medicare Other | Admitting: Hematology and Oncology

## 2011-05-27 DIAGNOSIS — C189 Malignant neoplasm of colon, unspecified: Secondary | ICD-10-CM

## 2011-09-15 ENCOUNTER — Ambulatory Visit (INDEPENDENT_AMBULATORY_CARE_PROVIDER_SITE_OTHER): Payer: Medicare Other | Admitting: Family Medicine

## 2011-09-15 ENCOUNTER — Encounter: Payer: Self-pay | Admitting: Family Medicine

## 2011-09-15 DIAGNOSIS — E119 Type 2 diabetes mellitus without complications: Secondary | ICD-10-CM

## 2011-09-15 DIAGNOSIS — Z23 Encounter for immunization: Secondary | ICD-10-CM

## 2011-09-15 DIAGNOSIS — N3000 Acute cystitis without hematuria: Secondary | ICD-10-CM

## 2011-09-15 LAB — POCT UA - MICROALBUMIN

## 2011-09-15 LAB — POCT GLYCOSYLATED HEMOGLOBIN (HGB A1C): Hemoglobin A1C: 7.3

## 2011-09-15 MED ORDER — AMBULATORY NON FORMULARY MEDICATION: Status: DC

## 2011-09-15 MED ORDER — CIPROFLOXACIN HCL 500 MG PO TABS: 500.0000 mg | ORAL_TABLET | Freq: Two times a day (BID) | ORAL | Status: DC

## 2011-09-15 NOTE — Progress Notes (Signed)
  Subjective:    Patient ID: Brian Strickland, male    DOB: Oct 17, 1946, 65 y.o.   MRN: [id number]  Diabetes He presents for his follow-up diabetic visit. He has type 2 diabetes mellitus. His disease course has been stable. There are no hypoglycemic associated symptoms. Pertinent negatives for diabetes include no blurred vision, no foot paresthesias, no polydipsia, no polyphagia, no polyuria and no weight loss. Symptoms are stable. Current diabetic treatment includes intensive insulin program and oral agent (monotherapy). He is compliant with treatment all of the time. His weight is stable. He is following a diabetic diet. He participates in exercise intermittently. His breakfast blood glucose range is generally 130-140 mg/dl. An ACE inhibitor/angiotensin II receptor blocker is being taken.   Recurrent cystitis-he was previously seen by urology for recurrent cystitis. Palpation Dr. Cathey Endow agreed to take over prescribing his as needed Cipro. He is out of the medication and started noticing some dysuria a couple days ago. No fevers or back pain. He would like a refill.  Review of Systems  Constitutional: Negative for weight loss.  Eyes: Negative for blurred vision.  Genitourinary: Negative for polyuria.  Hematological: Negative for polydipsia and polyphagia.       Objective:   Physical Exam  Constitutional: He is oriented to person, place, and time. He appears well-developed and well-nourished.  HENT:  Head: Normocephalic and atraumatic.  Cardiovascular: Normal rate, regular rhythm and normal heart sounds.   Pulmonary/Chest: Effort normal and breath sounds normal.  Neurological: He is alert and oriented to person, place, and time.  Skin: Skin is warm and dry.  Psychiatric: He has a normal mood and affect. His behavior is normal.          Assessment & Plan:  DM- His A1C is up from 7.1. Discussed getting more exercise ( he admits he has slacked off) and increase his levemir to 28 units bid  from 26 units bid.  Followup in 3 months. He can increase his Levemir 30 units if his sugars are still not well controlled. Also encouraged him to make sure he is eating a diabetic diet and really monitoring his current intake. Lab Results  Component Value Date   HGBA1C 7.3 09/15/2011     HTN - BP looks graet today.   Recurrent cystitis - Needs refill on his cipro. He uses prn when he gets sxs. Was previously seen by Urology.   Getting shingles vaccine was discussed.. Prescription given, should be taken to the pharmacy.

## 2011-09-15 NOTE — Patient Instructions (Signed)
Try to increase your activity level and really watch your carbs and sweets Follow up in three months.

## 2011-09-17 ENCOUNTER — Telehealth: Payer: Self-pay | Admitting: Family Medicine

## 2011-09-17 NOTE — Telephone Encounter (Signed)
Recall his oncologist and see if they can fax over his shot record. He thinks he may have had a pneumonia vaccine but is not sure like to get the records to confirm.

## 2011-09-24 NOTE — Telephone Encounter (Signed)
They didn't have a shot record for him

## 2011-09-25 NOTE — Telephone Encounter (Signed)
Will you call and let him know they didn't have a shot record and that I would like fo him to get his pneumonia vaccine at his next OV

## 2011-09-26 NOTE — Telephone Encounter (Signed)
LMOM informing Pt  

## 2011-10-15 ENCOUNTER — Other Ambulatory Visit: Payer: Self-pay | Admitting: *Deleted

## 2011-10-15 MED ORDER — INSULIN ASPART 100 UNIT/ML ~~LOC~~ SOLN: SUBCUTANEOUS | Status: DC

## 2011-10-23 ENCOUNTER — Other Ambulatory Visit: Payer: Self-pay | Admitting: Family Medicine

## 2011-10-23 MED ORDER — INSULIN ASPART 100 UNIT/ML ~~LOC~~ SOLN: SUBCUTANEOUS | Status: DC

## 2011-11-26 ENCOUNTER — Other Ambulatory Visit (HOSPITAL_BASED_OUTPATIENT_CLINIC_OR_DEPARTMENT_OTHER): Payer: Medicare Other | Admitting: Lab

## 2011-11-26 DIAGNOSIS — C189 Malignant neoplasm of colon, unspecified: Secondary | ICD-10-CM

## 2011-11-26 LAB — COMPREHENSIVE METABOLIC PANEL
ALT: 30 U/L (ref 0–53)
AST: 25 U/L (ref 0–37)
Creatinine, Ser: 1.19 mg/dL (ref 0.50–1.35)
Sodium: 137 mEq/L (ref 135–145)
Total Bilirubin: 0.4 mg/dL (ref 0.3–1.2)

## 2011-12-17 ENCOUNTER — Ambulatory Visit (INDEPENDENT_AMBULATORY_CARE_PROVIDER_SITE_OTHER): Payer: Medicare Other | Admitting: Family Medicine

## 2011-12-17 ENCOUNTER — Encounter: Payer: Self-pay | Admitting: Family Medicine

## 2011-12-17 VITALS — BP 105/74 | HR 88 | Ht 73.0 in | Wt 300.0 lb

## 2011-12-17 DIAGNOSIS — I10 Essential (primary) hypertension: Secondary | ICD-10-CM

## 2011-12-17 DIAGNOSIS — E119 Type 2 diabetes mellitus without complications: Secondary | ICD-10-CM

## 2011-12-17 DIAGNOSIS — E669 Obesity, unspecified: Secondary | ICD-10-CM

## 2011-12-17 NOTE — Progress Notes (Signed)
  Subjective:    Patient ID: Brian Strickland, male    DOB: 1946-02-03, 66 y.o.   MRN: [id number]  Diabetes He presents for his follow-up diabetic visit. He has type 2 diabetes mellitus. His disease course has been worsening. Pertinent negatives for diabetes include no blurred vision, no chest pain, no foot paresthesias, no foot ulcerations, no polydipsia, no polyphagia and no polyuria. There are no hypoglycemic complications. There are no diabetic complications. Risk factors for coronary artery disease include no known risk factors. Current diabetic treatment includes oral agent (dual therapy) and oral agent (monotherapy). He is compliant with treatment all of the time. His weight is increasing steadily. He rarely participates in exercise. His home blood glucose trend is increasing steadily. His breakfast blood glucose range is generally 130-140 mg/dl. His dinner blood glucose range is generally 140-180 mg/dl. An ACE inhibitor/angiotensin II receptor blocker is being taken. Eye exam is current.  Hypertension This is a chronic problem. Pertinent negatives include no blurred vision, chest pain, peripheral edema or shortness of breath. There are no associated agents to hypertension. Past treatments include beta blockers and ACE inhibitors. There are no compliance problems.       Review of Systems  Eyes: Negative for blurred vision.  Respiratory: Negative for shortness of breath.   Cardiovascular: Negative for chest pain.  Genitourinary: Negative for polyuria.  Hematological: Negative for polydipsia and polyphagia.       Objective:   Physical Exam  Constitutional: He is oriented to person, place, and time. He appears well-developed and well-nourished.  HENT:  Head: Normocephalic and atraumatic.  Cardiovascular: Normal rate, regular rhythm and normal heart sounds.   Pulmonary/Chest: Effort normal and breath sounds normal.  Neurological: He is alert and oriented to person, place, and time.    Skin: Skin is warm and dry.  Psychiatric: He has a normal mood and affect. His behavior is normal.          Assessment & Plan:  HTN- well controlled. Increased atenolol to bid since data supports bid instead of QD.  Continue ACEi.    DM-Not well controlled. Discussed working on exercise as much as possible. This would reallly make a big difference for his weight and his blood sugars. He says he will really try to work on this. In the mtantime inc Levemir to 30 units BID.  On ACEi. F/u in 3 months. Has appt at the [place] in march.  Also need uptodate Lipid panel. Given slip today. He can go later this mo since not fasting.  Lab Results  Component Value Date   HGBA1C 7.6 12/17/2011

## 2011-12-17 NOTE — Patient Instructions (Signed)
Increase atenolol to Twice a day Increase levemir to 30 units twice a day.   Make sure exercising for 30 minutes 5 days per week.

## 2012-01-16 ENCOUNTER — Telehealth: Payer: Self-pay | Admitting: *Deleted

## 2012-01-16 ENCOUNTER — Other Ambulatory Visit: Payer: Self-pay | Admitting: Family Medicine

## 2012-01-16 DIAGNOSIS — Z125 Encounter for screening for malignant neoplasm of prostate: Secondary | ICD-10-CM

## 2012-01-16 NOTE — Telephone Encounter (Signed)
Type in " screen prostate" and it will pull the code.

## 2012-01-16 NOTE — Telephone Encounter (Signed)
This patient wants to add a PSA to his blood work. What dx do i use

## 2012-01-17 LAB — LIPID PANEL
Cholesterol: 142 mg/dL (ref 0–200)
HDL: 35 mg/dL — ABNORMAL LOW
LDL Cholesterol: 79 mg/dL (ref 0–99)
Total CHOL/HDL Ratio: 4.1 ratio
Triglycerides: 139 mg/dL
VLDL: 28 mg/dL (ref 0–40)

## 2012-01-17 LAB — COMPLETE METABOLIC PANEL WITH GFR
AST: 30 U/L (ref 0–37)
Albumin: 4.3 g/dL (ref 3.5–5.2)
BUN: 20 mg/dL (ref 6–23)
CO2: 23 mEq/L (ref 19–32)
Calcium: 9.7 mg/dL (ref 8.4–10.5)
Chloride: 103 mEq/L (ref 96–112)
GFR, Est African American: 66 mL/min
GFR, Est Non African American: 57 mL/min — ABNORMAL LOW
Glucose, Bld: 195 mg/dL — ABNORMAL HIGH (ref 70–99)
Potassium: 5.3 mEq/L (ref 3.5–5.3)

## 2012-01-20 LAB — PSA, TOTAL AND FREE
PSA, Free Pct: 17 % — ABNORMAL LOW (ref >25)
PSA, Free: 0.3 ng/mL

## 2012-01-29 ENCOUNTER — Encounter: Payer: Self-pay | Admitting: Family Medicine

## 2012-01-29 ENCOUNTER — Ambulatory Visit (INDEPENDENT_AMBULATORY_CARE_PROVIDER_SITE_OTHER): Payer: Medicare Other | Admitting: Family Medicine

## 2012-01-29 VITALS — BP 116/58 | HR 47 | Temp 98.9°F | Ht 73.0 in | Wt 304.0 lb

## 2012-01-29 DIAGNOSIS — J029 Acute pharyngitis, unspecified: Secondary | ICD-10-CM

## 2012-01-29 DIAGNOSIS — J209 Acute bronchitis, unspecified: Secondary | ICD-10-CM

## 2012-01-29 DIAGNOSIS — J00 Acute nasopharyngitis [common cold]: Secondary | ICD-10-CM

## 2012-01-29 LAB — POCT RAPID STREP A (OFFICE): Rapid Strep A Screen: NEGATIVE

## 2012-01-29 MED ORDER — FEXOFENADINE-PSEUDOEPHED ER 180-240 MG PO TB24: 1.0000 | ORAL_TABLET | Freq: Every day | ORAL | Status: DC

## 2012-01-29 MED ORDER — CEFUROXIME AXETIL 500 MG PO TABS: 500.0000 mg | ORAL_TABLET | Freq: Two times a day (BID) | ORAL | Status: AC

## 2012-01-29 MED ORDER — ALBUTEROL SULFATE HFA 108 (90 BASE) MCG/ACT IN AERS: 2.0000 | INHALATION_SPRAY | RESPIRATORY_TRACT | Status: DC | PRN

## 2012-01-29 NOTE — Patient Instructions (Signed)

## 2012-01-29 NOTE — Progress Notes (Signed)
  Subjective:    Patient ID: Brian Strickland, male    DOB: [birth year], 66 y.o.   MRN: [id number]  Sore Throat  This is a new problem. The problem has been rapidly worsening. There has been no fever. Associated symptoms include congestion, coughing, headaches, a hoarse voice, shortness of breath, stridor, swollen glands and trouble swallowing. Pertinent negatives include no abdominal pain (should), diarrhea, drooling, ear discharge, ear pain, plugged ear sensation, neck pain or vomiting. He has had no exposure to strep or mono. He has tried acetaminophen (throat longenzer) for the symptoms. The treatment provided no relief.      Review of Systems  HENT: Positive for congestion, hoarse voice and trouble swallowing. Negative for ear pain, drooling, neck pain and ear discharge.   Respiratory: Positive for cough, shortness of breath and stridor.   Gastrointestinal: Negative for vomiting, abdominal pain (should) and diarrhea.  Neurological: Positive for headaches.  All other systems reviewed and are negative.      BP 116/58  Pulse 47  Temp(Src) 98.9 F (37.2 C) (Oral)  Ht 6\' 1"  (1.854 m)  Wt 304 lb (137.893 kg)  BMI 40.11 kg/m2  SpO2 96% Objective:   Physical Exam  Nursing note and vitals reviewed. Constitutional: He is oriented to person, place, and time. He appears well-developed and well-nourished. No distress.  HENT:  Head: Normocephalic and atraumatic.  Right Ear: Tympanic membrane normal.  Left Ear: Tympanic membrane normal.  Nose: Nose normal.  Mouth/Throat: Posterior oropharyngeal erythema present. No oropharyngeal exudate.  Eyes: Right eye exhibits no discharge. Left eye exhibits no discharge. No scleral icterus.  Neck: Neck supple.  Cardiovascular: Normal rate, regular rhythm and normal heart sounds.   Pulmonary/Chest: Effort normal. No respiratory distress. He has no wheezes. He has rhonchi. He has no rales.  Lymphadenopathy:    He has cervical adenopathy.  Neurological:  He is alert and oriented to person, place, and time.  Skin: Skin is warm and dry.          Assessment & Plan:  Nasopharyngitis/early bronchitis. I explained to patient that normally we wait 5-10 days before treating a nasopharyngitis but he and I both are concerned about his quick progression to a bronchitis. I will place him on Ceftin 500 mg twice a day x10 days, Allegra-D one tablet a day and an albuterol inhaler proair2 puffs every 2-3 hours when necessary for shortness of breath. Return next week if not improving.

## 2012-03-15 ENCOUNTER — Encounter: Payer: Self-pay | Admitting: Family Medicine

## 2012-03-15 ENCOUNTER — Ambulatory Visit (INDEPENDENT_AMBULATORY_CARE_PROVIDER_SITE_OTHER): Payer: Medicare Other | Admitting: Family Medicine

## 2012-03-15 VITALS — BP 129/70 | HR 82 | Ht 73.0 in | Wt 302.0 lb

## 2012-03-15 DIAGNOSIS — Z23 Encounter for immunization: Secondary | ICD-10-CM

## 2012-03-15 DIAGNOSIS — E119 Type 2 diabetes mellitus without complications: Secondary | ICD-10-CM

## 2012-03-15 LAB — POCT GLYCOSYLATED HEMOGLOBIN (HGB A1C): Hemoglobin A1C: 7.3

## 2012-03-15 MED ORDER — INSULIN DETEMIR 100 UNIT/ML ~~LOC~~ SOLN: 35.0000 [IU] | Freq: Two times a day (BID) | SUBCUTANEOUS | Status: DC

## 2012-03-15 NOTE — Patient Instructions (Signed)
Increase her Levemir 35 units twice a day. Continue to work on regular exercise, and healthy diet.

## 2012-03-15 NOTE — Progress Notes (Signed)
  Subjective:    Patient ID: Brian Strickland, male    DOB: 1945-12-10, 66 y.o.   MRN: [id number]  Diabetes He presents for his follow-up diabetic visit. He has type 2 diabetes mellitus. His disease course has been stable. There are no diabetic associated symptoms. Pertinent negatives for diabetes include no chest pain. Risk factors for coronary artery disease include male sex, obesity and sedentary lifestyle. He is compliant with treatment all of the time. He is following a diabetic diet. He has had a previous visit with a dietician. He participates in exercise weekly. His breakfast blood glucose range is generally 130-140 mg/dl. An ACE inhibitor/angiotensin II receptor blocker is being taken.      Review of Systems  Cardiovascular: Negative for chest pain.       Objective:   Physical Exam  Constitutional: He is oriented to person, place, and time. He appears well-developed and well-nourished.  HENT:  Head: Normocephalic and atraumatic.  Cardiovascular: Normal rate, regular rhythm and normal heart sounds.   Pulmonary/Chest: Effort normal and breath sounds normal.  Neurological: He is alert and oriented to person, place, and time.  Skin: Skin is warm and dry.  Psychiatric: He has a normal mood and affect. His behavior is normal.          Assessment & Plan:  DM - his A1c actually looks better today. It is down from 7.6 which is fantastic. I do recommend ahead and increase her Levemir to 35 units twice a day. Continue to work on exercise and diet and weight loss. He did lose about 50 pounds would make a significant difference to his diabetes and he could probably get off of insulin. BP at goal today.  Lab Results  Component Value Date   HGBA1C 7.3 03/15/2012

## 2012-03-19 ENCOUNTER — Other Ambulatory Visit: Payer: Self-pay | Admitting: *Deleted

## 2012-03-19 MED ORDER — DICLOFENAC SODIUM 1 % TD GEL: 1.0000 "application " | Freq: Four times a day (QID) | TRANSDERMAL | Status: DC

## 2012-03-25 ENCOUNTER — Telehealth: Payer: Self-pay | Admitting: *Deleted

## 2012-03-25 NOTE — Telephone Encounter (Signed)
Received call from [facility]. States Voltaren has been approved from 03/22/12 with no end date while pt on plan.

## 2012-05-18 ENCOUNTER — Other Ambulatory Visit (HOSPITAL_BASED_OUTPATIENT_CLINIC_OR_DEPARTMENT_OTHER): Payer: Medicare Other | Admitting: Lab

## 2012-05-18 ENCOUNTER — Ambulatory Visit (HOSPITAL_COMMUNITY)
Admission: RE | Admit: 2012-05-18 | Discharge: 2012-05-18 | Disposition: A | Discharge status: Home / Self Care | Payer: Medicare Other | Source: Ambulatory Visit | Attending: Hematology and Oncology | Admitting: Hematology and Oncology

## 2012-05-18 ENCOUNTER — Encounter (HOSPITAL_COMMUNITY): Payer: Self-pay

## 2012-05-18 DIAGNOSIS — C189 Malignant neoplasm of colon, unspecified: Secondary | ICD-10-CM | POA: Insufficient documentation

## 2012-05-18 DIAGNOSIS — J8410 Pulmonary fibrosis, unspecified: Secondary | ICD-10-CM | POA: Insufficient documentation

## 2012-05-18 HISTORY — DX: Essential (primary) hypertension: I10

## 2012-05-18 LAB — CBC WITH DIFFERENTIAL/PLATELET
BASO%: 0.6 % (ref 0.0–2.0)
LYMPH%: 36.6 % (ref 14.0–49.0)
MCHC: 33.9 g/dL (ref 32.0–36.0)
MONO#: 0.7 10*3/uL (ref 0.1–0.9)
NEUT#: 2.4 10*3/uL (ref 1.5–6.5)
Platelets: 179 10*3/uL (ref 140–400)
RBC: 4.82 10*6/uL (ref 4.20–5.82)
RDW: 13.7 % (ref 11.0–14.6)
WBC: 5.1 10*3/uL (ref 4.0–10.3)
lymph#: 1.9 10*3/uL (ref 0.9–3.3)

## 2012-05-18 LAB — CMP (CANCER CENTER ONLY)
ALT(SGPT): 37 U/L (ref 10–47)
AST: 28 U/L (ref 11–38)
Creat: 1.2 mg/dl (ref 0.6–1.2)
Sodium: 137 mEq/L (ref 128–145)
Total Bilirubin: 0.7 mg/dl (ref 0.20–1.60)
Total Protein: 8 g/dL (ref 6.4–8.1)

## 2012-05-18 MED ORDER — IOHEXOL 300 MG/ML  SOLN
125.0000 mL | Freq: Once | INTRAMUSCULAR | Status: AC | PRN
  Administered 2012-05-18: 125 mL via INTRAVENOUS

## 2012-05-18 NOTE — Progress Notes (Signed)
Pt's CMET today faxed to pt's PCP Dr. [name] at [phone], per MD request.

## 2012-05-19 ENCOUNTER — Telehealth: Payer: Self-pay | Admitting: Family Medicine

## 2012-05-19 DIAGNOSIS — E875 Hyperkalemia: Secondary | ICD-10-CM

## 2012-05-19 NOTE — Telephone Encounter (Signed)
Pt.notified

## 2012-05-19 NOTE — Telephone Encounter (Signed)
Call patient: I received a copy of the lab report ordered at the [facility]. His potassium was elevated at 5.1. I would first like to recheck this level. Sometimes the blood hemolyzed and is being drawn and can cause the potassium to look high. I will order a BMP. If it is still elevated then it could be coming from his lisinopril which case we may need to adjust his dose. This is important to followup, because of the potassium goes too high it can cause problems with the heart.

## 2012-05-25 ENCOUNTER — Ambulatory Visit (HOSPITAL_BASED_OUTPATIENT_CLINIC_OR_DEPARTMENT_OTHER): Payer: Medicare Other | Admitting: Hematology and Oncology

## 2012-05-25 ENCOUNTER — Encounter: Payer: Self-pay | Admitting: Hematology and Oncology

## 2012-05-25 ENCOUNTER — Telehealth: Payer: Self-pay | Admitting: Hematology and Oncology

## 2012-05-25 VITALS — BP 113/75 | HR 78 | Temp 98.3°F | Ht 73.0 in | Wt 298.2 lb

## 2012-05-25 DIAGNOSIS — C189 Malignant neoplasm of colon, unspecified: Secondary | ICD-10-CM

## 2012-05-25 LAB — BASIC METABOLIC PANEL WITH GFR
BUN: 27 mg/dL — ABNORMAL HIGH (ref 6–23)
CO2: 26 mEq/L (ref 19–32)
Calcium: 9.8 mg/dL (ref 8.4–10.5)
Chloride: 103 mEq/L (ref 96–112)
Creat: 1.58 mg/dL — ABNORMAL HIGH (ref 0.50–1.35)
Glucose, Bld: 182 mg/dL — ABNORMAL HIGH (ref 70–99)

## 2012-05-25 NOTE — Patient Instructions (Addendum)
Brian Strickland  [id number]  [facility] Discharge Instructions  RECOMMENDATIONS MADE BY THE CONSULTANT AND ANY TEST RESULTS WILL BE SENT TO YOUR REFERRING DOCTOR.   EXAM FINDINGS BY MD TODAY AND SIGNS AND SYMPTOMS TO REPORT TO CLINIC OR PRIMARY MD:   Your current list of medications are: Current Outpatient Prescriptions  Medication Sig Dispense Refill  . aspirin 81 MG tablet Take 81 mg by mouth daily.        . diclofenac sodium (VOLTAREN) 1 % GEL Apply 1 application topically 4 (four) times daily.  100 g  1  . fexofenadine (ALLEGRA) 180 MG tablet Take 180 mg by mouth as needed.      . insulin aspart (NOVOLOG FLEXPEN) 100 UNIT/ML injection Inject for SSI.  3 pen  4  . insulin detemir (LEVEMIR) 100 UNIT/ML injection Inject 30 Units into the skin 2 (two) times daily.      Marland Kitchen lisinopril (PRINIVIL,ZESTRIL) 20 MG tablet Take 10 mg by mouth daily. Take  10 mg  By mouth daily.      Marland Kitchen LORazepam (ATIVAN) 1 MG tablet Take 1 tablet (1 mg total) by mouth 2 (two) times daily as needed.  60 tablet  0  . metFORMIN (GLUMETZA) 1000 MG (MOD) 24 hr tablet Take 1,000 mg by mouth 2 (two) times daily with a meal.        . pravastatin (PRAVACHOL) 10 MG tablet Take 10 mg by mouth daily.      . traMADol (ULTRAM) 50 MG tablet Take 2 tabs po bid  120 tablet  0  . DISCONTD: insulin detemir (LEVEMIR) 100 UNIT/ML injection Inject 35 Units into the skin 2 (two) times daily.  18 mL  2     INSTRUCTIONS GIVEN AND DISCUSSED:   SPECIAL INSTRUCTIONS/FOLLOW-UP:  See above.  I acknowledge that I have been informed and understand all the instructions given to me and received a copy. I do not have any more questions at this time, but understand that I may call the [facility] at [phone] during business hours should I have any further questions or need assistance in obtaining follow-up care.

## 2012-05-25 NOTE — Progress Notes (Signed)
This office note has been dictated.

## 2012-05-25 NOTE — Telephone Encounter (Signed)
appts made and printed for pt aom °

## 2012-05-25 NOTE — Progress Notes (Signed)
CC:   Brian Strickland, M.D. Brian Strickland, M.D. Brian Strickland, M.D.  IDENTIFYING STATEMENT:  The patient is a 66 year old man who presents for followup.  PROBLEM LIST.: 1. 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  Status post resection     followed by 2 cycles of FOLFOX and followed by 6 cycles of Xeloda     from 10/05/2005 through 06/13/2006.  Oxaliplatin was discontinued     secondary to debilitating neuropathy. 2. 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 3. Status post bypass graft x4 vessels in 1998. 4. Type 2 diabetes. 5. Hypertension.  INTERVAL HISTORY:  Brian Strickland was last seen 9 months ago.  He has had no major issues or concerns since his last followup visit.  Neuropathy is stable.  He has not lost weight.  He denies change in bowel function.  Reviewed recent CT scans of the chest, abdomen and pelvis on 05/18/2012. The CT of the chest, abdomen and pelvis, indicates no evidence of metastatic disease.  Degenerative changes were seen in the lumbar spine.  MEDICATIONS:  Reviewed and updated.  ALLERGIES:  Reviewed and updated.  PAST MEDICAL HISTORY/FAMILY HISTORY/SOCIAL HISTORY:  Unchanged.  REVIEW OF SYSTEMS:  10-point review of systems negative.  PHYSICAL EXAM:  The patient is a well-appearing, well-nourished man in no distress.  Vitals:  Pulse 78, blood pressure 113/75, temperature 98.3, respirations 20, weight 298.2 pounds.  HEENT:  Head is atraumatic, normocephalic.  Sclerae anicteric.  Mouth moist.  Chest:  Clear.  CVS: Unremarkable.  Abdomen:  Soft, nontender.  Bowel sounds present. Extremities:  No edema.  CNS:  Nonfocal.  LAB DATA:  05/18/2012 white cell count 5.1, hemoglobin 15.6, hematocrit 46.1, platelets 179.  Sodium 137, potassium 5.1, chloride 97, CO2 28, BUN 20, creatinine 1.2,  glucose 134.  T bilirubin 0.7, alkaline phosphatase 59, AST 28, ALT is 37, calcium 9.0.  CEA 1.8.  Results of CT as in interval history.  IMPRESSION AND PLAN:  Brian Strickland is a 66 year old man with history of recurrent colon cancer.  He is status post resection receiving 6 months of adjuvant oxaliplatin based therapy for 2 cycles.  As a result of neuropathy, oxaliplatin was discontinued.  He continued on Xeloda.  His current CTs and blood work indicate no evidence of recurrence.  He receives colonoscopy every 1-2 years with Dr. Madilyn Strickland. The patient follows up in a year's time with CTs and labs.    ______________________________ Laurice Record, M.D. LIO/MEDQ  D:  05/25/2012  T:  05/25/2012  Job:  [id number]

## 2012-06-15 ENCOUNTER — Encounter: Payer: Self-pay | Admitting: Family Medicine

## 2012-06-15 ENCOUNTER — Ambulatory Visit (INDEPENDENT_AMBULATORY_CARE_PROVIDER_SITE_OTHER): Payer: Medicare Other | Admitting: Family Medicine

## 2012-06-15 VITALS — BP 104/73 | HR 90 | Ht 73.0 in | Wt 299.0 lb

## 2012-06-15 DIAGNOSIS — I10 Essential (primary) hypertension: Secondary | ICD-10-CM

## 2012-06-15 DIAGNOSIS — C189 Malignant neoplasm of colon, unspecified: Secondary | ICD-10-CM

## 2012-06-15 DIAGNOSIS — E119 Type 2 diabetes mellitus without complications: Secondary | ICD-10-CM

## 2012-06-15 DIAGNOSIS — Z1331 Encounter for screening for depression: Secondary | ICD-10-CM

## 2012-06-15 DIAGNOSIS — Z9181 History of falling: Secondary | ICD-10-CM

## 2012-06-15 LAB — POCT GLYCOSYLATED HEMOGLOBIN (HGB A1C): Hemoglobin A1C: 7.3

## 2012-06-15 MED ORDER — INSULIN DETEMIR 100 UNIT/ML ~~LOC~~ SOLN: 36.0000 [IU] | Freq: Two times a day (BID) | SUBCUTANEOUS | Status: DC

## 2012-06-15 NOTE — Progress Notes (Signed)
  Subjective:    Patient ID: Brian Strickland, male    DOB: Apr 06, 1946, 66 y.o.   MRN: [id number]  Diabetes He presents for his follow-up diabetic visit. He has type 2 diabetes mellitus. His disease course has been stable. There are no hypoglycemic associated symptoms. Pertinent negatives for diabetes include no blurred vision, no foot paresthesias, no foot ulcerations, no polydipsia, no polyphagia, no polyuria and no visual change. Symptoms are improving. He is compliant with treatment all of the time. He participates in exercise every other day. His breakfast blood glucose range is generally 130-140 mg/dl. An ACE inhibitor/angiotensin II receptor blocker is being taken. Eye exam is current.      Review of Systems  Eyes: Negative for blurred vision.  Genitourinary: Negative for polyuria.  Hematological: Negative for polydipsia and polyphagia.       Objective:   Physical Exam  Constitutional: He is oriented to person, place, and time. He appears well-developed and well-nourished.  HENT:  Head: Normocephalic and atraumatic.  Cardiovascular: Normal rate, regular rhythm and normal heart sounds.   Pulmonary/Chest: Effort normal and breath sounds normal.  Neurological: He is alert and oriented to person, place, and time.  Skin: Skin is warm and dry.  Psychiatric: He has a normal mood and affect. His behavior is normal.          Assessment & Plan:  DM- Uncontrolled.  Still not at goal. Increase Levemir to 36 units BID and really work on diet, exercise, and weight loss.  F/U in 3 months.  No refills today.BP well controlled.   Depression screen -PHQ- 9 socre of 2, neg for depression  Fall risk assessment. - Score of 3, low risk for falls.   HTN- Well contorlled. Continue ACEi.

## 2012-06-15 NOTE — Patient Instructions (Addendum)
Increase levemir to 36 units.

## 2012-09-01 ENCOUNTER — Telehealth: Payer: Self-pay | Admitting: Family Medicine

## 2012-09-01 ENCOUNTER — Encounter: Payer: Self-pay | Admitting: *Deleted

## 2012-09-01 NOTE — Telephone Encounter (Signed)
Mailed letter to patient about insulin changes in [place], 2014

## 2012-09-01 NOTE — Telephone Encounter (Signed)
Please call patient and let him know I received a letter from his insurance company. As of January 1 20 to change his NovoLog flex pen to Humalog. It does come in a pen form. This is based on his insurance coverage and he can continue his current regimen as of January 1 we will need to change him for cost savings. The NovoLog will no longer be covered under his plan at that time.

## 2012-09-17 ENCOUNTER — Ambulatory Visit: Payer: Medicare Other | Admitting: Family Medicine

## 2012-10-01 ENCOUNTER — Ambulatory Visit (INDEPENDENT_AMBULATORY_CARE_PROVIDER_SITE_OTHER): Payer: Medicare Other | Admitting: Family Medicine

## 2012-10-01 ENCOUNTER — Encounter: Payer: Self-pay | Admitting: Family Medicine

## 2012-10-01 VITALS — BP 101/69 | HR 101 | Ht 74.0 in | Wt 300.0 lb

## 2012-10-01 DIAGNOSIS — E1149 Type 2 diabetes mellitus with other diabetic neurological complication: Secondary | ICD-10-CM

## 2012-10-01 DIAGNOSIS — E119 Type 2 diabetes mellitus without complications: Secondary | ICD-10-CM

## 2012-10-01 DIAGNOSIS — I10 Essential (primary) hypertension: Secondary | ICD-10-CM

## 2012-10-01 LAB — POCT UA - MICROALBUMIN
Albumin/Creatinine Ratio, Urine, POC: <30
Creatinine, POC: 300 mg/dL

## 2012-10-01 MED ORDER — SAXAGLIPTIN-METFORMIN ER 5-500 MG PO TB24: 1.0000 | ORAL_TABLET | Freq: Every day | ORAL | Status: DC

## 2012-10-01 MED ORDER — CIPROFLOXACIN HCL 500 MG PO TABS: 500.0000 mg | ORAL_TABLET | Freq: Two times a day (BID) | ORAL | Status: DC

## 2012-10-01 NOTE — Progress Notes (Signed)
  Subjective:    Patient ID: Brian Strickland, male    DOB: July 11, 1946, 66 y.o.   MRN: [id number]  HPI Waking up with sugar 17-180.  Was at 115 at bedtime for about 10 days about 3 hours after a meal. No hypoglycemic events. No cuts or sore that are not healing well. Taking meds and insulin regularly.w alking daily and going to the gyn 2x/week.  Hypertension-no chest pain or short of breath. Taking medications regularly. Some exercise. Review of Systems     Objective:   Physical Exam  Constitutional: He is oriented to person, place, and time. He appears well-developed and well-nourished.  HENT:  Head: Normocephalic and atraumatic.  Cardiovascular: Normal rate, regular rhythm and normal heart sounds.   Pulmonary/Chest: Effort normal and breath sounds normal.  Neurological: He is alert and oriented to person, place, and time.  Skin: Skin is warm and dry.  Psychiatric: He has a normal mood and affect. His behavior is normal.          Assessment & Plan:  DM - uncontrolled. Discontinue metformin and start Kombiglyze XR5/1001 today. Given samples for 1 month and prescription sent to pharmacy. Followup in 3 months. Also encouraged him to increase his exercise routine to 3-4 days per week. Exam and urine microalbumin performed today. He does have neuropathy.  HTN - well controlled.

## 2012-10-28 ENCOUNTER — Other Ambulatory Visit: Payer: Self-pay | Admitting: Family Medicine

## 2012-10-28 ENCOUNTER — Other Ambulatory Visit: Payer: Self-pay | Admitting: *Deleted

## 2012-11-29 ENCOUNTER — Telehealth: Payer: Self-pay | Admitting: Family Medicine

## 2012-11-29 NOTE — Telephone Encounter (Signed)
Call patient: Please let him know that I got a letter from his insurance company. They'll no longer cover NovoLog flex pen. He will have to either change to Humalog or Apidra. I'll be happy to switch him whenever he would like.

## 2012-11-30 ENCOUNTER — Encounter: Payer: Self-pay | Admitting: Family Medicine

## 2012-11-30 ENCOUNTER — Ambulatory Visit (INDEPENDENT_AMBULATORY_CARE_PROVIDER_SITE_OTHER): Payer: Medicare Other | Admitting: Family Medicine

## 2012-11-30 VITALS — BP 108/68 | HR 98 | Resp 18 | Wt 297.0 lb

## 2012-11-30 DIAGNOSIS — I10 Essential (primary) hypertension: Secondary | ICD-10-CM

## 2012-11-30 DIAGNOSIS — E669 Obesity, unspecified: Secondary | ICD-10-CM

## 2012-11-30 DIAGNOSIS — IMO0001 Reserved for inherently not codable concepts without codable children: Secondary | ICD-10-CM

## 2012-11-30 MED ORDER — HUMALOG KWIKPEN 100 UNIT/ML ~~LOC~~ SOPN: 15.0000 [IU] | PEN_INJECTOR | Freq: Three times a day (TID) | SUBCUTANEOUS | Status: DC

## 2012-11-30 MED ORDER — SAXAGLIPTIN-METFORMIN ER 5-1000 MG PO TB24: 1.0000 | ORAL_TABLET | Freq: Every day | ORAL | Status: DC

## 2012-11-30 NOTE — Progress Notes (Signed)
  Subjective:    Patient ID: Brian Strickland, male    DOB: 10-09-1946, 67 y.o.   MRN: [id number]  HPI DM- A1C, Foot exam and Microalbumin 10/01/2012- Eye Exam 09/16/2012. He is having trouble with the cost of his insulin and the Kombiglyze putting him in the doughnut hole. When he first went on the Kombiglyze he was taking 03/999 bid and his blood sugar was controlled. When the Kombiglyze was sent to the pharmacy the strength was 5/500 and once daily. He is aware he may have been taking the medication bid by his misunderstanding. No hypoglycemic events Sugars runnin around 140 in AM. No cuts or sores that aren't healing well.  Feels his sugars are slowly trending down.  He also needs to change his NovoLog per a letter received from his insurance company saying that NovoLog is no longer covered. He does prefer the pen.   HTN-  Pt denies chest pain, SOB, dizziness, or heart palpitations.  Taking meds as directed w/o problems.  Denies medication side effects.      Review of Systems     Objective:   Physical Exam  Constitutional: He is oriented to person, place, and time. He appears well-developed and well-nourished.  HENT:  Head: Normocephalic and atraumatic.  Cardiovascular: Normal rate, regular rhythm and normal heart sounds.   Pulmonary/Chest: Effort normal and breath sounds normal.  Neurological: He is alert and oriented to person, place, and time.  Skin: Skin is warm and dry.  Psychiatric: He has a normal mood and affect. His behavior is normal.          Assessment & Plan:  DM- Uncontrolled in November.  Plan is to re-run med for this new year to see if any changes to the formulary. May want to call around and price shop as well. Samples given today. F/U in early March.  We also received a letter from insurance saying they no longer cover her NovoLog. We will switch him to Humalog which covered on the plan. New prescriptions sent to pharmacy. Call if any problems with these new  medications.  HTN - well controlled. Still on cutting his lisinopril in half for 10mg .  Continue current regimen.   Obesity - has lost 3lbs which is fantasti.

## 2012-11-30 NOTE — Patient Instructions (Addendum)
Will change Kombiglyze XR once a day Call me if want to change it because of cost.   Will need to fast for labs for follow-up

## 2012-11-30 NOTE — Telephone Encounter (Signed)
Addressed at appointment

## 2013-01-01 ENCOUNTER — Telehealth: Payer: Self-pay | Admitting: Oncology

## 2013-01-01 ENCOUNTER — Encounter: Payer: Self-pay | Admitting: Oncology

## 2013-01-01 NOTE — Telephone Encounter (Signed)
s.w pt and   advised on new Dr. Cleophas Dunker....pt ok and aware....former pt of Dr. Marton Redwood

## 2013-01-01 NOTE — Telephone Encounter (Signed)
sent letter and July appt schedule to pt

## 2013-01-04 ENCOUNTER — Ambulatory Visit: Payer: Medicare Other | Admitting: Family Medicine

## 2013-01-06 ENCOUNTER — Telehealth: Payer: Self-pay | Admitting: *Deleted

## 2013-01-06 MED ORDER — AMBULATORY NON FORMULARY MEDICATION: Status: AC

## 2013-01-06 NOTE — Telephone Encounter (Signed)
Okay for new glucometer. Does do as a non-ambulatory medication we'll need to fax over prescription.

## 2013-01-06 NOTE — Telephone Encounter (Signed)
Patient calls and needs a new rx sent for a glucometer to pharmacy. His quit working

## 2013-01-12 ENCOUNTER — Ambulatory Visit (INDEPENDENT_AMBULATORY_CARE_PROVIDER_SITE_OTHER): Payer: Medicare Other | Admitting: Family Medicine

## 2013-01-12 ENCOUNTER — Encounter: Payer: Self-pay | Admitting: Family Medicine

## 2013-01-12 VITALS — BP 124/67 | HR 85 | Ht 74.0 in | Wt 296.0 lb

## 2013-01-12 DIAGNOSIS — I10 Essential (primary) hypertension: Secondary | ICD-10-CM

## 2013-01-12 DIAGNOSIS — C9200 Acute myeloblastic leukemia, not having achieved remission: Secondary | ICD-10-CM

## 2013-01-12 DIAGNOSIS — E1149 Type 2 diabetes mellitus with other diabetic neurological complication: Secondary | ICD-10-CM

## 2013-01-12 LAB — LIPID PANEL
Cholesterol: 194 mg/dL (ref 0–200)
LDL Cholesterol: 122 mg/dL — ABNORMAL HIGH (ref 0–99)
Triglycerides: 163 mg/dL — ABNORMAL HIGH (ref <150)

## 2013-01-12 LAB — COMPLETE METABOLIC PANEL WITH GFR
ALT: 29 U/L (ref 0–53)
CO2: 27 mEq/L (ref 19–32)
Calcium: 9.8 mg/dL (ref 8.4–10.5)
Chloride: 103 mEq/L (ref 96–112)
Creat: 1.28 mg/dL (ref 0.50–1.35)
GFR, Est African American: 67 mL/min
GFR, Est Non African American: 58 mL/min — ABNORMAL LOW
Glucose, Bld: 159 mg/dL — ABNORMAL HIGH (ref 70–99)

## 2013-01-12 LAB — POCT GLYCOSYLATED HEMOGLOBIN (HGB A1C): Hemoglobin A1C: 7.6

## 2013-01-12 MED ORDER — INSULIN ASPART 100 UNIT/ML ~~LOC~~ SOLN: SUBCUTANEOUS | Status: DC

## 2013-01-12 NOTE — Progress Notes (Signed)
  Subjective:    Patient ID: Brian Strickland, male    DOB: 09/13/1946, 67 y.o.   MRN: [id number]  HPI DM- Says sugars have been well controlled.  No cuts or sores that arean't healing well. Taking meds as directed. No hypoglycemic events.    HTN-  Pt denies chest pain, SOB, dizziness, or heart palpitations.  Taking meds as directed w/o problems.  Denies medication side effects.     Review of Systems     Objective:   Physical Exam  Constitutional: He is oriented to person, place, and time. He appears well-developed and well-nourished.  HENT:  Head: Normocephalic and atraumatic.  Cardiovascular: Normal rate, regular rhythm and normal heart sounds.   Pulmonary/Chest: Effort normal and breath sounds normal.  Neurological: He is alert and oriented to person, place, and time.  Skin: Skin is warm and dry.  Psychiatric: He has a normal mood and affect. His behavior is normal.          Assessment & Plan:  DM-Uncontrolled.   Increase levemir to 40 units BID.  Continue SSI for meals.  Will change back to metformin since the kombiglize is too expensive.  n ACE, ASA, statin. F/U in 3 months.   HTN- Well controlled. Continue current regimen.  F/U in 6 mo

## 2013-01-17 ENCOUNTER — Other Ambulatory Visit: Payer: Self-pay | Admitting: Family Medicine

## 2013-01-17 DIAGNOSIS — C189 Malignant neoplasm of colon, unspecified: Secondary | ICD-10-CM

## 2013-01-17 MED ORDER — PRAVASTATIN SODIUM 20 MG PO TABS: 20.0000 mg | ORAL_TABLET | Freq: Every day | ORAL | Status: DC

## 2013-03-24 ENCOUNTER — Telehealth: Payer: Self-pay | Admitting: *Deleted

## 2013-03-24 NOTE — Telephone Encounter (Signed)
Pt's wife called in and stated that pt is not feeling well thinks that he may have bronchitis. appt made for tomorrow.Brian Strickland

## 2013-03-25 ENCOUNTER — Ambulatory Visit (INDEPENDENT_AMBULATORY_CARE_PROVIDER_SITE_OTHER): Payer: Medicare Other | Admitting: Family Medicine

## 2013-03-25 ENCOUNTER — Encounter: Payer: Self-pay | Admitting: Family Medicine

## 2013-03-25 VITALS — BP 100/62 | HR 80 | Temp 98.0°F | Wt 281.0 lb

## 2013-03-25 DIAGNOSIS — J209 Acute bronchitis, unspecified: Secondary | ICD-10-CM

## 2013-03-25 MED ORDER — LORAZEPAM 1 MG PO TABS: 1.0000 mg | ORAL_TABLET | Freq: Two times a day (BID) | ORAL | Status: DC | PRN

## 2013-03-25 MED ORDER — AZITHROMYCIN 250 MG PO TABS: ORAL_TABLET | ORAL | Status: DC

## 2013-03-25 NOTE — Progress Notes (Signed)
  Subjective:    Patient ID: Brian Strickland, male    DOB: 18-Feb-1946, 67 y.o.   MRN: [id number]  HPI  5 days started with ST. The developed cough with white sputum, fever, and aches.  Then felt a little better yesterday afternoon, and dose feel some bettter today. Still feels weak.  Wife is now sick.  Says afraid it will linger.  No SOB.  Lots of sputum, says could fill a "bucket".  No GI upset.  Taking tylenol prn fever.  No nasal congestion or facial pressure.   Review of Systems     Objective:   Physical Exam  Constitutional: He is oriented to person, place, and time. He appears well-developed and well-nourished.  HENT:  Head: Normocephalic and atraumatic.  Right Ear: External ear normal.  Left Ear: External ear normal.  Nose: Nose normal.  Mouth/Throat: Oropharynx is clear and moist.  TMs and canals are clear.   Eyes: Conjunctivae and EOM are normal. Pupils are equal, round, and reactive to light.  Neck: Neck supple. No thyromegaly present.  Cardiovascular: Normal rate and normal heart sounds.   Pulmonary/Chest: Effort normal and breath sounds normal.  Lymphadenopathy:    He has no cervical adenopathy.  Neurological: He is alert and oriented to person, place, and time.  Skin: Skin is warm and dry.  Psychiatric: He has a normal mood and affect.          Assessment & Plan:  Bronchitis - likely viral. He actually did not have a fever last night and does feel better today. I think is actually getting over this on his own. Because it is Friday we can write him to the weekend I did give him prescription for azithromycin to fill only if he does not continue to improve or if he suddenly gets worse but otherwise I strongly encouraged him to give it a couple more days to see if he continues to improve on his own. He was agreeable to this care plan.

## 2013-04-15 ENCOUNTER — Ambulatory Visit (INDEPENDENT_AMBULATORY_CARE_PROVIDER_SITE_OTHER): Payer: Medicare Other | Admitting: Family Medicine

## 2013-04-15 ENCOUNTER — Telehealth: Payer: Self-pay | Admitting: Family Medicine

## 2013-04-15 ENCOUNTER — Encounter: Payer: Self-pay | Admitting: Family Medicine

## 2013-04-15 VITALS — BP 124/84 | HR 91 | Wt 297.0 lb

## 2013-04-15 DIAGNOSIS — I10 Essential (primary) hypertension: Secondary | ICD-10-CM

## 2013-04-15 DIAGNOSIS — E785 Hyperlipidemia, unspecified: Secondary | ICD-10-CM

## 2013-04-15 DIAGNOSIS — E1149 Type 2 diabetes mellitus with other diabetic neurological complication: Secondary | ICD-10-CM

## 2013-04-15 NOTE — Progress Notes (Signed)
  Subjective:    Patient ID: Brian Strickland, male    DOB: 1946/01/18, 67 y.o.   MRN: [id number]  HPI DM- Sugars have been normal. No wounds that aren't healing well. No hypoglyemic. Has been working on diet and weight loss. Says sugars were up a little when he had bronchitis but seem to be back down.   HTN- Pt denies chest pain, SOB, dizziness, or heart palpitations.  Taking meds as directed w/o problems.  Denies medication side effects.    Lab Results  Component Value Date   CHOL 194 01/12/2013   HDL 39* 01/12/2013   LDLCALC 122* 01/12/2013   TRIG 163* 01/12/2013   CHOLHDL 5.0 01/12/2013   Hyperlipidemia-he is really working on diet and exercise and try to get his cluster around ventrally. He is taking his pravastatin daily without side effects or problems. Review of Systems     Objective:   Physical Exam  Constitutional: He is oriented to person, place, and time. He appears well-developed and well-nourished.  HENT:  Head: Normocephalic and atraumatic.  Cardiovascular: Normal rate, regular rhythm and normal heart sounds.   Pulmonary/Chest: Effort normal and breath sounds normal.  Neurological: He is alert and oriented to person, place, and time.  Skin: Skin is warm and dry.  Psychiatric: He has a normal mood and affect. His behavior is normal.          Assessment & Plan:  DM-A1c is still slightly elevated above goal. Increase her Levemir to 36 units at bedtime. Encouraged him to use it at lunch and with his evening meal. Followup in 3 months. He is on a statin, ACE inhibitor, aspirin. Lab Results  Component Value Date   HGBA1C 7.5 04/15/2013    HTN- Well controlled.  Continue current regimen. Followup in 3 months.  Hyperlipidemia-discussed the need to increase his pravastatin to a more moderate or high-dose based on his diabetes. He says he really wants to hold off and continue his current regimen. He really wants to work on exercise and diet and weight loss. I think all these  things are great. We can readdress the cholesterol again at his followup appointment.

## 2013-04-15 NOTE — Telephone Encounter (Signed)
Please call patient. His A1c is 7.5 today. Still above goal. I would like him to increase his Levemir to 36 units at bedtime. He may also need to consider using his mealtime insulin with his lunch and his evening meal. Followup in 3 months.

## 2013-04-18 NOTE — Telephone Encounter (Signed)
Pt called and informed to increase Levemir to 36 units at QHS and to consider using mealtime insulin w/lunch and evening meal. Pt voiced understanding and agreed.Loralee Pacas [place]

## 2013-04-22 ENCOUNTER — Other Ambulatory Visit: Payer: Self-pay | Admitting: Family Medicine

## 2013-05-19 ENCOUNTER — Other Ambulatory Visit: Payer: Self-pay

## 2013-05-23 ENCOUNTER — Encounter (HOSPITAL_COMMUNITY): Payer: Self-pay

## 2013-05-23 ENCOUNTER — Other Ambulatory Visit (HOSPITAL_BASED_OUTPATIENT_CLINIC_OR_DEPARTMENT_OTHER): Payer: Medicare Other | Admitting: Lab

## 2013-05-23 ENCOUNTER — Ambulatory Visit (HOSPITAL_COMMUNITY)
Admission: RE | Admit: 2013-05-23 | Discharge: 2013-05-23 | Disposition: A | Discharge status: Home / Self Care | Payer: Medicare Other | Source: Ambulatory Visit | Attending: Hematology and Oncology | Admitting: Hematology and Oncology

## 2013-05-23 DIAGNOSIS — C189 Malignant neoplasm of colon, unspecified: Secondary | ICD-10-CM

## 2013-05-23 DIAGNOSIS — Z9221 Personal history of antineoplastic chemotherapy: Secondary | ICD-10-CM | POA: Insufficient documentation

## 2013-05-23 DIAGNOSIS — Z9049 Acquired absence of other specified parts of digestive tract: Secondary | ICD-10-CM | POA: Insufficient documentation

## 2013-05-23 LAB — CBC WITH DIFFERENTIAL/PLATELET
BASO%: 0.9 % (ref 0.0–2.0)
LYMPH%: 32.9 % (ref 14.0–49.0)
MCHC: 33.9 g/dL (ref 32.0–36.0)
MCV: 94.5 fL (ref 79.3–98.0)
MONO#: 0.7 10*3/uL (ref 0.1–0.9)
MONO%: 12.3 % (ref 0.0–14.0)
Platelets: 227 10*3/uL (ref 140–400)
RBC: 4.94 10*6/uL (ref 4.20–5.82)
WBC: 6.1 10*3/uL (ref 4.0–10.3)

## 2013-05-23 LAB — COMPREHENSIVE METABOLIC PANEL (CC13)
ALT: 35 U/L (ref 0–55)
AST: 27 U/L (ref 5–34)
Creatinine: 1.5 mg/dL — ABNORMAL HIGH (ref 0.7–1.3)
Total Bilirubin: 0.31 mg/dL (ref 0.20–1.20)

## 2013-05-23 LAB — CEA: CEA: 1.7 ng/mL (ref 0.0–5.0)

## 2013-05-23 MED ORDER — IOHEXOL 300 MG/ML  SOLN
100.0000 mL | Freq: Once | INTRAMUSCULAR | Status: AC | PRN
  Administered 2013-05-23: 125 mL via INTRAVENOUS

## 2013-05-25 ENCOUNTER — Ambulatory Visit (HOSPITAL_BASED_OUTPATIENT_CLINIC_OR_DEPARTMENT_OTHER): Payer: Medicare Other | Admitting: Hematology and Oncology

## 2013-05-25 ENCOUNTER — Ambulatory Visit: Payer: Medicare Other | Admitting: Hematology and Oncology

## 2013-05-25 ENCOUNTER — Telehealth: Payer: Self-pay | Admitting: Hematology and Oncology

## 2013-05-25 VITALS — BP 113/68 | HR 94 | Temp 98.1°F | Resp 18 | Ht 74.0 in | Wt 295.5 lb

## 2013-05-25 DIAGNOSIS — C189 Malignant neoplasm of colon, unspecified: Secondary | ICD-10-CM

## 2013-05-25 DIAGNOSIS — Z85038 Personal history of other malignant neoplasm of large intestine: Secondary | ICD-10-CM | POA: Insufficient documentation

## 2013-05-25 NOTE — Telephone Encounter (Signed)
Gave pt apptr for lab and MD on January 2015

## 2013-05-25 NOTE — Progress Notes (Signed)
CC:   Brian Strickland, M.D. Brian Strickland, M.D. Brian Strickland, M.D.  IDENTIFYING STATEMENT:  The patient is a 67 year old man who presents for followup.  PROBLEM LIST.: 1. 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  Status post resection     followed by 2 cycles of FOLFOX and followed by 6 cycles of Xeloda     from 10/05/2005 through 06/13/2006.  Oxaliplatin was discontinued     secondary to debilitating neuropathy. 2. 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 3. Status post bypass graft x4 vessels in 1998. 4. Type 2 diabetes. 5. Hypertension.  INTERVAL HISTORY:  Brian Strickland was last seen 1 year ago.  He has had no major issues or concerns since his last followup visit.  Neuropathy is stable.  He has not lost weight.  He denies change in bowel function.  Reviewed recent CT scans of the chest, abdomen and pelvis on 05/23/2013. The CT of the chest, abdomen and pelvis, indicates no evidence of metastatic disease.  Degenerative changes were seen in the lumbar spine.  MEDICATIONS:  Reviewed and updated.  ALLERGIES:  Reviewed and updated.  PAST MEDICAL HISTORY/FAMILY HISTORY/SOCIAL HISTORY:  Unchanged.  REVIEW OF SYSTEMS:  10-point review of systems negative. Blood pressure 113/68, pulse 94, temperature 98.1 F (36.7 C), temperature source Oral, resp. rate 18, height 6\' 2"  (1.88 m), weight 295 lb 8 oz (134.038 kg).  PHYSICAL EXAM:  The patient is a well-appearing, well-nourished man in no distress.Weight 298.2 pounds.  HEENT:  Head is atraumatic, normocephalic.  Sclerae anicteric.  Mouth moist.  Chest:  Clear.  CVS: Unremarkable.  Abdomen:  Soft, nontender.  Bowel sounds present. Extremities:  No edema.  CNS:  Nonfocal.  LAB DATA:   CBC    Component Value Date/Time   WBC 6.1 05/23/2013 0818   WBC 5.0  06/24/2010 1519   RBC 4.94 05/23/2013 0818   RBC 4.48 06/24/2010 1519   HGB 15.8 05/23/2013 0818   HGB 14.8 09/02/2010 2020   HCT 46.7 05/23/2013 0818   HCT 45.0 09/02/2010 2020   PLT 227 05/23/2013 0818   PLT 248 06/24/2010 1519   MCV 94.5 05/23/2013 0818   MCV 88.2 06/24/2010 1519   MCH 32.0 05/23/2013 0818   MCH 29.0 08/26/2010 1231   MCHC 33.9 05/23/2013 0818   MCHC 30.6 06/24/2010 1519   RDW 13.4 05/23/2013 0818   RDW 14.5 06/24/2010 1519   LYMPHSABS 2.0 05/23/2013 0818   LYMPHSABS 1.9 06/24/2010 1519   MONOABS 0.7 05/23/2013 0818   MONOABS 0.6 06/24/2010 1519   EOSABS 0.1 05/23/2013 0818   EOSABS 0.0 06/24/2010 1519   BASOSABS 0.1 05/23/2013 0818   BASOSABS 0.0 06/24/2010 1519   Lab Results  Component Value Date   GLUCOSE 218* 05/23/2013   BUN 21.5 05/23/2013   CO2 25 05/23/2013   ALT 35 05/23/2013   AST 27 05/23/2013   LDH 133 12/03/2009   K 5.0 05/23/2013   CREATININE 1.5* 05/23/2013   CEA 1.7     IMPRESSION AND PLAN:  Brian Strickland is a 67 year old man with history of recurrent colon cancer.  He is status post resection receiving 6 months of adjuvant oxaliplatin based therapy for 2 cycles.  As a result of neuropathy, oxaliplatin was discontinued.  He continued on Xeloda.  His current CTs  and blood work indicate no evidence of recurrence.  He receives colonoscopy every 1-2 years with Dr. Madilyn Strickland, next one is in September of this year. The patient follows up in a clinic visit in 6 months then in a year time with CTs and labs.  Brian Dakins, MD 05/25/2013 9:52 AM

## 2013-06-03 ENCOUNTER — Encounter: Payer: Self-pay | Admitting: Family Medicine

## 2013-07-19 ENCOUNTER — Ambulatory Visit: Payer: Medicare Other | Admitting: Family Medicine

## 2013-07-22 ENCOUNTER — Other Ambulatory Visit: Payer: Self-pay | Admitting: Family Medicine

## 2013-07-25 ENCOUNTER — Other Ambulatory Visit: Payer: Self-pay | Admitting: Family Medicine

## 2013-08-05 ENCOUNTER — Encounter: Payer: Self-pay | Admitting: Family Medicine

## 2013-08-05 ENCOUNTER — Ambulatory Visit (INDEPENDENT_AMBULATORY_CARE_PROVIDER_SITE_OTHER): Payer: Medicare Other | Admitting: Family Medicine

## 2013-08-05 VITALS — BP 137/82 | HR 94 | Wt 297.0 lb

## 2013-08-05 DIAGNOSIS — I10 Essential (primary) hypertension: Secondary | ICD-10-CM

## 2013-08-05 DIAGNOSIS — E1149 Type 2 diabetes mellitus with other diabetic neurological complication: Secondary | ICD-10-CM

## 2013-08-05 DIAGNOSIS — E785 Hyperlipidemia, unspecified: Secondary | ICD-10-CM

## 2013-08-05 DIAGNOSIS — Z23 Encounter for immunization: Secondary | ICD-10-CM

## 2013-08-05 LAB — POCT UA - MICROALBUMIN: Microalbumin Ur, POC: 80 mg/L

## 2013-08-05 LAB — POCT GLYCOSYLATED HEMOGLOBIN (HGB A1C): Hemoglobin A1C: 7.3

## 2013-08-05 MED ORDER — CANAGLIFLOZIN 100 MG PO TABS: 100.0000 mg | ORAL_TABLET | Freq: Every day | ORAL | Status: DC

## 2013-08-05 MED ORDER — CIPROFLOXACIN HCL 500 MG PO TABS: 500.0000 mg | ORAL_TABLET | Freq: Two times a day (BID) | ORAL | Status: AC

## 2013-08-05 NOTE — Progress Notes (Signed)
  Subjective:    Patient ID: Brian Strickland, male    DOB: 01/22/1946, 67 y.o.   MRN: [id number]  HPI DM-  No hypoglycemic events. No wounds or sores that are not healing well. Taking medications and tolerating it well without any side effects.  On Levemir and Novolog.  VA stopped hig glipizide.  Lab Results  Component Value Date   HGBA1C 7.5 04/15/2013    HTN-  Pt denies chest pain, SOB, dizziness, or heart palpitations.  Taking meds as directed w/o problems.  Denies medication side effects  Hyperlipidemia-tolerating Pravachol well without any side effects or problems.  .Review of Systems     Objective:   Physical Exam  Constitutional: He is oriented to person, place, and time. He appears well-developed and well-nourished.  HENT:  Head: Normocephalic and atraumatic.  Cardiovascular: Normal rate, regular rhythm and normal heart sounds.   Pulmonary/Chest: Effort normal and breath sounds normal.  Neurological: He is alert and oriented to person, place, and time.  Skin: Skin is warm and dry.  Psychiatric: He has a normal mood and affect. His behavior is normal.          Assessment & Plan:  DM- a1c is 7.3  Today. Mild improvement form last time. Discussed tx options. Will start invokana. Warned about potential side effects including increased risk of UTIs and yeast infections. He will keep an eye on this. We'll see what his A1c is when he comes back in 3 months and whether not worth continuing the medication. If he gets to the pharmacy and the medication is quite expensive encouraged him to call me and we can try something else like possible early Victoza. On statin, ACE, ASA   HTN - well controlled. Continue current regimen.  Hyperlipidemia- doing well.  Continue pravastatin Lab Results  Component Value Date   CHOL 194 01/12/2013   HDL 39* 01/12/2013   LDLCALC 122* 01/12/2013   TRIG 163* 01/12/2013   CHOLHDL 5.0 01/12/2013    Flu vaccine given.

## 2013-08-23 ENCOUNTER — Encounter: Payer: Self-pay | Admitting: Family Medicine

## 2013-09-12 ENCOUNTER — Encounter: Payer: Self-pay | Admitting: Family Medicine

## 2013-09-25 ENCOUNTER — Encounter: Payer: Self-pay | Admitting: Family Medicine

## 2013-10-28 ENCOUNTER — Other Ambulatory Visit: Payer: Self-pay | Admitting: Family Medicine

## 2013-11-14 ENCOUNTER — Ambulatory Visit (INDEPENDENT_AMBULATORY_CARE_PROVIDER_SITE_OTHER): Payer: Medicare Other | Admitting: Family Medicine

## 2013-11-14 ENCOUNTER — Encounter: Payer: Self-pay | Admitting: Family Medicine

## 2013-11-14 VITALS — BP 101/68 | HR 91 | Temp 98.3°F | Ht 74.0 in | Wt 300.0 lb

## 2013-11-14 DIAGNOSIS — E7800 Pure hypercholesterolemia, unspecified: Secondary | ICD-10-CM

## 2013-11-14 DIAGNOSIS — E1129 Type 2 diabetes mellitus with other diabetic kidney complication: Secondary | ICD-10-CM

## 2013-11-14 DIAGNOSIS — E1121 Type 2 diabetes mellitus with diabetic nephropathy: Secondary | ICD-10-CM

## 2013-11-14 DIAGNOSIS — I10 Essential (primary) hypertension: Secondary | ICD-10-CM

## 2013-11-14 DIAGNOSIS — N058 Unspecified nephritic syndrome with other morphologic changes: Secondary | ICD-10-CM

## 2013-11-14 LAB — POCT GLYCOSYLATED HEMOGLOBIN (HGB A1C): HEMOGLOBIN A1C: 7.5

## 2013-11-14 NOTE — Progress Notes (Signed)
   Subjective:    Patient ID: Brian Strickland, male    DOB: 1946/05/15, 68 y.o.   MRN: [id number]  HPI Diabetes - no hypoglycemic events. No wounds or sores that are not healing well. No increased thirst or urination. Checking glucose at home. Taking medications as prescribed without any side effects. He is taking Invokana Levemir, metformin, NovoLog flex pen.   Hypertension- Pt denies chest pain, SOB, dizziness, or heart palpitations.  Taking meds as directed w/o problems.  Denies medication side effects.    Hyperlipidemia - tolerating statin well. W/ no S.E.  Lab Results  Component Value Date   CHOL 194 01/12/2013   HDL 39* 01/12/2013   LDLCALC 122* 01/12/2013   TRIG 163* 01/12/2013   CHOLHDL 5.0 01/12/2013     Review of Systems     Objective:   Physical Exam  Constitutional: He is oriented to person, place, and time. He appears well-developed and well-nourished.  HENT:  Head: Normocephalic and atraumatic.  Cardiovascular: Normal rate, regular rhythm and normal heart sounds.   Pulmonary/Chest: Effort normal and breath sounds normal.  Neurological: He is alert and oriented to person, place, and time.  Skin: Skin is warm and dry.  Psychiatric: He has a normal mood and affect. His behavior is normal.          Assessment & Plan:  Diabetes-on a statin, aspirin, ACE inhibitor. Uncontrolled. Will try farxiga instead of invokana. Given samples to try. F/U in 3-4 months.   Diabetes with peri-last occurred even positive again for small amounts of protein. He is on ACE inhibitor. We'll continue to monitor.  Hypertension-well controlled. F/U in 3-4 months.   Hyperlipidemia - well controlled. Repeat in 3 months.

## 2013-11-17 ENCOUNTER — Other Ambulatory Visit: Payer: Self-pay | Admitting: Medical Oncology

## 2013-11-17 DIAGNOSIS — C189 Malignant neoplasm of colon, unspecified: Secondary | ICD-10-CM

## 2013-11-18 ENCOUNTER — Other Ambulatory Visit (HOSPITAL_BASED_OUTPATIENT_CLINIC_OR_DEPARTMENT_OTHER): Payer: Medicare Other

## 2013-11-18 DIAGNOSIS — C189 Malignant neoplasm of colon, unspecified: Secondary | ICD-10-CM

## 2013-11-18 LAB — COMPREHENSIVE METABOLIC PANEL (CC13)
ALT: 29 U/L (ref 0–55)
AST: 24 U/L (ref 5–34)
Albumin: 4.1 g/dL (ref 3.5–5.0)
Alkaline Phosphatase: 73 U/L (ref 40–150)
Anion Gap: 11 mEq/L (ref 3–11)
BILIRUBIN TOTAL: 0.47 mg/dL (ref 0.20–1.20)
BUN: 25.6 mg/dL (ref 7.0–26.0)
CO2: 23 mEq/L (ref 22–29)
Calcium: 10.1 mg/dL (ref 8.4–10.4)
Chloride: 105 mEq/L (ref 98–109)
Creatinine: 1.4 mg/dL — ABNORMAL HIGH (ref 0.7–1.3)
GLUCOSE: 185 mg/dL — AB (ref 70–140)
Potassium: 4.6 mEq/L (ref 3.5–5.1)
SODIUM: 138 meq/L (ref 136–145)
Total Protein: 7.9 g/dL (ref 6.4–8.3)

## 2013-11-18 LAB — CBC WITH DIFFERENTIAL/PLATELET
BASO%: 1.2 % (ref 0.0–2.0)
Basophils Absolute: 0.1 10*3/uL (ref 0.0–0.1)
EOS%: 1.9 % (ref 0.0–7.0)
Eosinophils Absolute: 0.1 10*3/uL (ref 0.0–0.5)
HCT: 47.4 % (ref 38.4–49.9)
HGB: 15.8 g/dL (ref 13.0–17.1)
LYMPH%: 35.6 % (ref 14.0–49.0)
MCH: 32.2 pg (ref 27.2–33.4)
MCHC: 33.3 g/dL (ref 32.0–36.0)
MCV: 96.6 fL (ref 79.3–98.0)
MONO#: 0.8 10*3/uL (ref 0.1–0.9)
MONO%: 14.5 % — AB (ref 0.0–14.0)
NEUT#: 2.6 10*3/uL (ref 1.5–6.5)
NEUT%: 46.8 % (ref 39.0–75.0)
PLATELETS: 205 10*3/uL (ref 140–400)
RBC: 4.91 10*6/uL (ref 4.20–5.82)
RDW: 13.5 % (ref 11.0–14.6)
WBC: 5.6 10*3/uL (ref 4.0–10.3)
lymph#: 2 10*3/uL (ref 0.9–3.3)

## 2013-11-18 LAB — CEA: CEA: 2.1 ng/mL (ref 0.0–5.0)

## 2013-11-22 ENCOUNTER — Encounter: Payer: Self-pay | Admitting: Family Medicine

## 2013-11-25 ENCOUNTER — Ambulatory Visit (HOSPITAL_BASED_OUTPATIENT_CLINIC_OR_DEPARTMENT_OTHER): Payer: Medicare Other | Admitting: Internal Medicine

## 2013-11-25 ENCOUNTER — Telehealth: Payer: Self-pay | Admitting: Internal Medicine

## 2013-11-25 VITALS — BP 103/66 | HR 97 | Temp 98.8°F | Resp 18 | Ht 74.0 in | Wt 299.7 lb

## 2013-11-25 DIAGNOSIS — D509 Iron deficiency anemia, unspecified: Secondary | ICD-10-CM

## 2013-11-25 DIAGNOSIS — C189 Malignant neoplasm of colon, unspecified: Secondary | ICD-10-CM

## 2013-11-25 DIAGNOSIS — G589 Mononeuropathy, unspecified: Secondary | ICD-10-CM

## 2013-11-25 NOTE — Progress Notes (Signed)
[place], Brian Strickland  [id number]  DIAGNOSIS: Colon cancer - Plan: CBC with Differential, Comprehensive metabolic panel (Cmet) - [facility], [facility]  ANEMIA, IRON DEFICIENCY  Chief Complaint  Patient presents with  . Colon Cancer    CURRENT THERAPY:  INTERVAL HISTORY: Brian Strickland 68 y.o. male with a history of colon cancer presents for follow-up. He has had no major issues or concerns since his last followup visit. Neuropathy is stable. He has not lost weight. He denies change in bowel function. He followed up with Dr. Amedeo Strickland for repeat colonoscopy a few weeks ago which he reports was negative.  He denies hospitalizations or emergency room visits.    PROBLEM LIST:  1. Colon cancer initially diagnosed in 1999 status post resection  followed by 6 months of adjuvant 5-FU leucovorin with recurrence  07/2005 with mucinous tumor T4 N0 and T1 N0. Status post resection  followed by 2 cycles of FOLFOX and followed by 6 cycles of Xeloda  from 10/05/2005 through 06/13/2006. Oxaliplatin was discontinued  secondary to debilitating neuropathy.  2. 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  3. Status post bypass graft x4 vessels in 1998.  4. Type 2 diabetes.  5. Hypertension.  MEDICAL HISTORY: Past Medical History  Diagnosis Date  . Urethral meatal stenosis     S/P dilatation (Dr. Geroge Strickland)  . CAD (coronary artery disease)   . Diabetes mellitus     Type 2  . 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 colon ca dx'd 1914/ 2006    colon/recurrent  . Hypertension     INTERIM HISTORY: has HYPERCHOLESTEROLEMIA; ANEMIA, IRON DEFICIENCY; PERNICIOUS ANEMIA; ERECTILE DYSFUNCTION; NEUROPATHY, PERIPHERAL; HYPERTENSION, BENIGN SYSTEMIC; ARTERIOSCLEROSIS, CORONARY; LUMBAGO; DIABETES MELLITUS, WITH NEUROLOGICAL COMPLICATIONS; Obesity; Colon cancer; and Diabetic nephropathy  with proteinuria on his problem list.    ALLERGIES:  is allergic to carbamazepine; invokana; morphine; nortriptyline hcl; and oxycodone hcl.  MEDICATIONS: has a current medication list which includes the following prescription(s): AMBULATORY NON FORMULARY MEDICATION, aspirin, fexofenadine, insulin aspart, insulin detemir, lisinopril, lorazepam, metformin, pravastatin, tramadol, and voltaren.  SURGICAL HISTORY:  Past Surgical History  Procedure Laterality Date  . Cholectomy    . Colon cancer  [phone]    recurrence  . Coronary artery bypass graft    . Refractive surgery      REVIEW OF SYSTEMS:   Constitutional: Denies fevers, chills or abnormal weight loss Eyes: Denies blurriness of vision Ears, nose, mouth, throat, and face: Denies mucositis or sore throat Respiratory: Denies cough, dyspnea or wheezes Cardiovascular: Denies palpitation, chest discomfort or lower extremity swelling Gastrointestinal:  Denies nausea, heartburn or change in bowel habits Skin: Denies abnormal skin rashes Lymphatics: Denies new lymphadenopathy or easy bruising Neurological: He has numbness, tingling in hands and feet which is stable but denies new weaknesses Behavioral/Psych: Mood is stable, no new changes  All other systems were reviewed with the patient and are negative.  PHYSICAL EXAMINATION: ECOG PERFORMANCE STATUS: 0 - Asymptomatic  Blood pressure 103/66, pulse 97, temperature 98.8 F (37.1 C), temperature source Oral, resp. rate 18, height 6\' 2"  (1.88 m), weight 299 lb 11.2 oz (135.943 kg).  GENERAL:alert, no distress and comfortable, well developed and well nourished, moderately obese SKIN: skin color, texture, turgor are normal, no rashes or significant lesions EYES: normal, Conjunctiva are pink and non-injected, sclera clear OROPHARYNX:no exudate, no erythema and lips, buccal mucosa, and  tongue normal  NECK: supple, thyroid normal size, non-tender, without nodularity LYMPH:  no palpable  lymphadenopathy in the cervical, axillary or supraclavicular LUNGS: clear to auscultation with normal breathing effort HEART: regular rate & rhythm and no murmurs and no lower extremity edema ABDOMEN:abdomen soft, non-tender and normal bowel sounds; scars well hellead Musculoskeletal:no cyanosis of digits and no clubbing  NEURO: alert & oriented x 3 with fluent speech, no focal motor/sensory deficits  Labs:  Lab Results  Component Value Date   WBC 5.6 11/18/2013   HGB 15.8 11/18/2013   HCT 47.4 11/18/2013   MCV 96.6 11/18/2013   PLT 205 11/18/2013   NEUTROABS 2.6 11/18/2013      Chemistry      Component Value Date/Time   NA 138 11/18/2013 0808   NA 139 01/12/2013 0836   NA 137 05/18/2012 0821   K 4.6 11/18/2013 0808   K 4.6 01/12/2013 0836   K 5.1* 05/18/2012 0821   CL 103 01/12/2013 0836   CL 97* 05/18/2012 0821   CO2 23 11/18/2013 0808   CO2 27 01/12/2013 0836   CO2 28 05/18/2012 0821   BUN 25.6 11/18/2013 0808   BUN 20 01/12/2013 0836   BUN 20 05/18/2012 0821   CREATININE 1.4* 11/18/2013 0808   CREATININE 1.28 01/12/2013 0836   CREATININE 1.19 11/26/2011 0946      Component Value Date/Time   CALCIUM 10.1 11/18/2013 0808   CALCIUM 9.8 01/12/2013 0836   CALCIUM 9.0 05/18/2012 0821   ALKPHOS 73 11/18/2013 0808   ALKPHOS 43 01/12/2013 0836   ALKPHOS 59 05/18/2012 0821   AST 24 11/18/2013 0808   AST 26 01/12/2013 0836   AST 28 05/18/2012 0821   ALT 29 11/18/2013 0808   ALT 29 01/12/2013 0836   ALT 37 05/18/2012 0821   BILITOT 0.47 11/18/2013 0808   BILITOT 0.5 01/12/2013 0836   BILITOT 0.70 05/18/2012 0821     Studies:  No results found.  Results for Brian, Strickland (MRN [id number]) as of 11/26/2013 13:21  Ref. Range 11/26/2011 09:46 05/18/2012 08:21 05/23/2013 08:19 11/18/2013 08:08  [facility] Latest Range: 0.0-5.0 ng/mL 1.0 1.8 1.7 2.1   RADIOGRAPHIC STUDIES: (Reviewed by me) 05/23/2013 CT CHEST, ABDOMEN AND PELVIS WITH CONTRAST  Technique: Multidetector CT imaging of the chest, abdomen and pelvis was performed following the standard  protocol during bolus administration of intravenous contrast. Contrast: 171mL OMNIPAQUE IOHEXOL 300 MG/ML SOLN Comparison: CT 05/18/2012 CT CHEST  Findings: No axillary or supraclavicular lymphadenopathy. No mediastinal or hilar lymphadenopathy. No pericardial fluid. No central pulmonary embolism. No new or suspicious pulmonary nodules. There is a fatty pleural thickening unchanged. IMPRESSION: No evidence of thoracic metastasis. CT ABDOMEN AND PELVIS Findings: There is no focal hepatic lesion. Gallbladder, pancreas, spleen, adrenal glands, and kidneys are normal. The stomach, small bowel and colon unchanged. There is near total colectomy with an anastomoses in the mid abdomen at the proximal descending colon. The rectosigmoid colon appears normal. Abdominal aorta is normal caliber. No retroperitoneal periportal  lymphadenopathy. No free fluid the pelvis. Prostate gland bladder normal. No pelvic lymphadenopathy. Review of bone windows demonstrates no  aggressive osseous lesions. IMPRESSION: 1. No evidence of colon cancer recurrence or metastasis within the abdomen or pelvis.  2. Stable colectomy anastomosis.   ASSESSMENT: Brian Strickland 68 y.o. male with a history of Colon cancer - Plan: CBC with Differential, Comprehensive metabolic panel (Cmet) - [facility], [facility]  ANEMIA, IRON DEFICIENCY   PLAN:   1. Recurrent Colon Cancer. --  Brian Strickland is a 68 year old man with history of recurrent colon cancer. He is status post resection receiving 6 months of adjuvant oxaliplatin based therapy for 2 cycles. As a result of neuropathy, oxaliplatin was discontinued. He continued on Xeloda.  --His recent CTs and blood work indicate no evidence of recurrence. He receives colonoscopy every 1-2 years with Dr. Amedeo Strickland, last one reportedly a few weeks ago and was negative (we will request a copy of the report).  Next one in 1-2 years.  We will plan on repeat CT of abdomen and pelvis in January 2016 or 18 months following last  one.  We discussed spacing scans out to avoid constrast exposure but encouraged him to report any clinical symptoms such as weight lost, stool changes, etc.    2. Follow-up.  The patient follows up in a clinic visit in 6 months then in a year time with CTs and labs.  All questions were answered. The patient knows to call the clinic with any problems, questions or concerns. We can certainly see the patient much sooner if necessary.  I spent 15 minutes counseling the patient face to face. The total time spent in the appointment was 25 minutes.    Shalese Strahan, MD 11/26/2013 1:20 PM

## 2013-11-25 NOTE — Telephone Encounter (Signed)
Gave pt appt for lab and MD for July 2015 °

## 2013-12-04 ENCOUNTER — Encounter: Payer: Self-pay | Admitting: Family Medicine

## 2013-12-05 MED ORDER — DAPAGLIFLOZIN PROPANEDIOL 5 MG PO TABS: 1.0000 | ORAL_TABLET | Freq: Every day | ORAL | Status: DC

## 2013-12-27 ENCOUNTER — Encounter: Payer: Self-pay | Admitting: Family Medicine

## 2014-01-11 ENCOUNTER — Encounter: Payer: Self-pay | Admitting: Family Medicine

## 2014-01-11 ENCOUNTER — Other Ambulatory Visit: Payer: Self-pay | Admitting: Family Medicine

## 2014-02-02 ENCOUNTER — Encounter: Payer: Self-pay | Admitting: Family Medicine

## 2014-02-13 ENCOUNTER — Ambulatory Visit (INDEPENDENT_AMBULATORY_CARE_PROVIDER_SITE_OTHER): Payer: Medicare Other | Admitting: Family Medicine

## 2014-02-13 ENCOUNTER — Encounter: Payer: Self-pay | Admitting: Family Medicine

## 2014-02-13 VITALS — BP 104/70 | HR 97 | Wt 302.0 lb

## 2014-02-13 DIAGNOSIS — I10 Essential (primary) hypertension: Secondary | ICD-10-CM

## 2014-02-13 DIAGNOSIS — E1149 Type 2 diabetes mellitus with other diabetic neurological complication: Secondary | ICD-10-CM

## 2014-02-13 LAB — POCT GLYCOSYLATED HEMOGLOBIN (HGB A1C)

## 2014-02-13 NOTE — Progress Notes (Signed)
   Subjective:    Patient ID: Brian Strickland, male    DOB: October 31, 1946, 68 y.o.   MRN: [id number]  HPI Diabetes - no hypoglycemic events. No wounds or sores that are not healing well. No increased thirst or urination. Checking glucose at home. Taking medications as prescribed without any side effects. Was without [place] for amost a month bc of insurance difficuly. Seen phone notes.  Sugars running 100-200.   Hypertension- Pt denies chest pain, SOB, dizziness, or heart palpitations.  Taking meds as directed w/o problems.  Denies medication side effects.    Hyperlipidemia - Doing well on statin. No myalgias or S.E.    Review of Systems     Objective:   Physical Exam  Constitutional: He is oriented to person, place, and time. He appears well-developed and well-nourished.  HENT:  Head: Normocephalic and atraumatic.  Cardiovascular: Normal rate, regular rhythm and normal heart sounds.   Pulmonary/Chest: Effort normal and breath sounds normal.  Neurological: He is alert and oriented to person, place, and time.  Skin: Skin is warm and dry.  Psychiatric: He has a normal mood and affect. His behavior is normal.          Assessment & Plan:  DM- Much improved. A1C is 7.1 today. F/U in 3 months. Work on diet, weight loss. Back on Farxiga  HTN - well controlled.  F/u in 3 months.

## 2014-02-17 LAB — COMPLETE METABOLIC PANEL WITH GFR
ALBUMIN: 4.1 g/dL (ref 3.5–5.2)
ALK PHOS: 50 U/L (ref 39–117)
ALT: 29 U/L (ref 0–53)
AST: 25 U/L (ref 0–37)
BUN: 22 mg/dL (ref 6–23)
CALCIUM: 10.1 mg/dL (ref 8.4–10.5)
CO2: 28 mEq/L (ref 19–32)
CREATININE: 1.22 mg/dL (ref 0.50–1.35)
Chloride: 99 mEq/L (ref 96–112)
GFR, EST NON AFRICAN AMERICAN: 61 mL/min
GFR, Est African American: 70 mL/min
GLUCOSE: 199 mg/dL — AB (ref 70–99)
POTASSIUM: 5.1 meq/L (ref 3.5–5.3)
Sodium: 135 mEq/L (ref 135–145)
Total Bilirubin: 0.6 mg/dL (ref 0.2–1.2)
Total Protein: 7.2 g/dL (ref 6.0–8.3)

## 2014-02-20 NOTE — Progress Notes (Signed)
Quick Note:  All labs are normal. ______ 

## 2014-03-02 ENCOUNTER — Other Ambulatory Visit: Payer: Self-pay | Admitting: Family Medicine

## 2014-04-29 ENCOUNTER — Other Ambulatory Visit: Payer: Self-pay | Admitting: Family Medicine

## 2014-04-30 ENCOUNTER — Encounter: Payer: Self-pay | Admitting: Family Medicine

## 2014-05-03 ENCOUNTER — Encounter: Payer: Self-pay | Admitting: Family Medicine

## 2014-05-16 ENCOUNTER — Encounter: Payer: Self-pay | Admitting: Family Medicine

## 2014-05-16 ENCOUNTER — Ambulatory Visit (INDEPENDENT_AMBULATORY_CARE_PROVIDER_SITE_OTHER): Payer: Medicare Other | Admitting: Family Medicine

## 2014-05-16 VITALS — BP 103/66 | HR 92 | Wt 296.0 lb

## 2014-05-16 DIAGNOSIS — I10 Essential (primary) hypertension: Secondary | ICD-10-CM

## 2014-05-16 DIAGNOSIS — E1149 Type 2 diabetes mellitus with other diabetic neurological complication: Secondary | ICD-10-CM

## 2014-05-16 DIAGNOSIS — E7800 Pure hypercholesterolemia, unspecified: Secondary | ICD-10-CM

## 2014-05-16 DIAGNOSIS — I2510 Atherosclerotic heart disease of native coronary artery without angina pectoris: Secondary | ICD-10-CM

## 2014-05-16 LAB — POCT GLYCOSYLATED HEMOGLOBIN (HGB A1C): Hemoglobin A1C: 7.2

## 2014-05-16 MED ORDER — LORAZEPAM 1 MG PO TABS: 1.0000 mg | ORAL_TABLET | Freq: Two times a day (BID) | ORAL | Status: DC | PRN

## 2014-05-16 NOTE — Progress Notes (Signed)
   Subjective:    Patient ID: Brian Strickland, male    DOB: 1946-06-13, 68 y.o.   MRN: [id number]  HPI Diabetes - no hypoglycemic events. No wounds or sores that are not healing well. No increased thirst or urination. Checking glucose at home. Taking medications as prescribed without any side effects. Has lost 6 more lbs.   Hypertension- Pt denies chest pain, SOB, dizziness, or heart palpitations.  Taking meds as directed w/o problems.  Denies medication side effects.    CAD-no chest pain or shortness of breath. He takes his medications consistently. He has been trying to walk or exercise to lose weight.  Review of Systems     Objective:   Physical Exam  Constitutional: He is oriented to person, place, and time. He appears well-developed and well-nourished.  HENT:  Head: Normocephalic and atraumatic.  Cardiovascular: Normal rate, regular rhythm and normal heart sounds.   Pulmonary/Chest: Effort normal and breath sounds normal.  Musculoskeletal: He exhibits no edema.  Neurological: He is alert and oriented to person, place, and time.  Skin: Skin is warm and dry.  Psychiatric: He has a normal mood and affect. His behavior is normal.          Assessment & Plan:  DM- uncontrolled. A1C is 7.2 today.  Increase the Levemir to 40 units at bedtime, continue 37 in AM. He is on a statin and ACE inhibitor. Also on aspirin daily. Followup in 3 months.  Hypertension-well-controlled. Blood pressures a little bit low. Continues to stay low especially with his weight loss we might be able to decrease his lisinopril down to 10 mg.  CAD-he is on aspirin and statin.  Hyperlipidemia-due to recheck lipids.

## 2014-05-19 ENCOUNTER — Other Ambulatory Visit (HOSPITAL_BASED_OUTPATIENT_CLINIC_OR_DEPARTMENT_OTHER): Payer: Medicare Other

## 2014-05-19 ENCOUNTER — Encounter: Payer: Self-pay | Admitting: Family Medicine

## 2014-05-19 DIAGNOSIS — N183 Chronic kidney disease, stage 3 (moderate): Secondary | ICD-10-CM

## 2014-05-19 DIAGNOSIS — C189 Malignant neoplasm of colon, unspecified: Secondary | ICD-10-CM

## 2014-05-19 DIAGNOSIS — N1832 Chronic kidney disease, stage 3b: Secondary | ICD-10-CM | POA: Insufficient documentation

## 2014-05-19 LAB — CBC WITH DIFFERENTIAL/PLATELET
BASO%: 0.9 % (ref 0.0–2.0)
BASOS ABS: 0.1 10*3/uL (ref 0.0–0.1)
EOS ABS: 0.1 10*3/uL (ref 0.0–0.5)
EOS%: 1.9 % (ref 0.0–7.0)
HEMATOCRIT: 46.5 % (ref 38.4–49.9)
HEMOGLOBIN: 15.4 g/dL (ref 13.0–17.1)
LYMPH%: 33 % (ref 14.0–49.0)
MCH: 31.9 pg (ref 27.2–33.4)
MCHC: 33.1 g/dL (ref 32.0–36.0)
MCV: 96.4 fL (ref 79.3–98.0)
MONO#: 0.7 10*3/uL (ref 0.1–0.9)
MONO%: 12.1 % (ref 0.0–14.0)
NEUT%: 52.1 % (ref 39.0–75.0)
NEUTROS ABS: 3 10*3/uL (ref 1.5–6.5)
PLATELETS: 197 10*3/uL (ref 140–400)
RBC: 4.83 10*6/uL (ref 4.20–5.82)
RDW: 13.6 % (ref 11.0–14.6)
WBC: 5.7 10*3/uL (ref 4.0–10.3)
lymph#: 1.9 10*3/uL (ref 0.9–3.3)

## 2014-05-19 LAB — BASIC METABOLIC PANEL WITH GFR
BUN: 26 mg/dL — ABNORMAL HIGH (ref 6–23)
CO2: 27 meq/L (ref 19–32)
Calcium: 9.7 mg/dL (ref 8.4–10.5)
Chloride: 103 mEq/L (ref 96–112)
Creat: 1.34 mg/dL (ref 0.50–1.35)
GFR, EST AFRICAN AMERICAN: 62 mL/min
GFR, Est Non African American: 54 mL/min — ABNORMAL LOW
GLUCOSE: 138 mg/dL — AB (ref 70–99)
POTASSIUM: 5.3 meq/L (ref 3.5–5.3)
SODIUM: 138 meq/L (ref 135–145)

## 2014-05-19 LAB — LIPID PANEL
CHOL/HDL RATIO: 3.9 ratio
CHOLESTEROL: 166 mg/dL (ref 0–200)
HDL: 43 mg/dL (ref >39)
LDL Cholesterol: 97 mg/dL (ref 0–99)
Triglycerides: 130 mg/dL (ref <150)
VLDL: 26 mg/dL (ref 0–40)

## 2014-05-19 LAB — COMPREHENSIVE METABOLIC PANEL (CC13)
ALBUMIN: 4 g/dL (ref 3.5–5.0)
ALK PHOS: 50 U/L (ref 40–150)
ALT: 25 U/L (ref 0–55)
AST: 21 U/L (ref 5–34)
Anion Gap: 9 mEq/L (ref 3–11)
BILIRUBIN TOTAL: 0.59 mg/dL (ref 0.20–1.20)
BUN: 25.9 mg/dL (ref 7.0–26.0)
CO2: 25 mEq/L (ref 22–29)
CREATININE: 1.4 mg/dL — AB (ref 0.7–1.3)
Calcium: 10 mg/dL (ref 8.4–10.4)
Chloride: 105 mEq/L (ref 98–109)
GLUCOSE: 150 mg/dL — AB (ref 70–140)
POTASSIUM: 4.9 meq/L (ref 3.5–5.1)
Sodium: 139 mEq/L (ref 136–145)
Total Protein: 7.6 g/dL (ref 6.4–8.3)

## 2014-05-19 LAB — CEA: CEA: 1.8 ng/mL (ref 0.0–5.0)

## 2014-05-26 ENCOUNTER — Ambulatory Visit (HOSPITAL_BASED_OUTPATIENT_CLINIC_OR_DEPARTMENT_OTHER): Payer: Medicare Other | Admitting: Internal Medicine

## 2014-05-26 ENCOUNTER — Encounter: Payer: Self-pay | Admitting: Internal Medicine

## 2014-05-26 ENCOUNTER — Telehealth: Payer: Self-pay | Admitting: Internal Medicine

## 2014-05-26 VITALS — BP 117/59 | HR 75 | Temp 98.4°F | Resp 18 | Ht 74.0 in | Wt 300.1 lb

## 2014-05-26 DIAGNOSIS — G609 Hereditary and idiopathic neuropathy, unspecified: Secondary | ICD-10-CM

## 2014-05-26 DIAGNOSIS — D509 Iron deficiency anemia, unspecified: Secondary | ICD-10-CM

## 2014-05-26 DIAGNOSIS — C189 Malignant neoplasm of colon, unspecified: Secondary | ICD-10-CM

## 2014-05-26 NOTE — Progress Notes (Signed)
[place], [place] [address]  DIAGNOSIS: Colon cancer - Plan: Comprehensive metabolic panel (Cmet) - [facility], Lactate dehydrogenase (LDH) - [facility], CBC with Differential, CEA, CT Abdomen Pelvis W Contrast, CT Chest W Contrast  ANEMIA, IRON DEFICIENCY  Chief Complaint  Patient presents with  . Follow-up    CURRENT THERAPY: Active surveillance.   INTERVAL HISTORY: Brian Strickland 68 y.o. male with a history of colon cancer presents for follow-up. He was last seen by me on 11/25/2013.  He has had no major issues or concerns since his last followup visit. Neuropathy is stable. He has not lost weight. He denies change in bowel function. He followed up with Dr. Amedeo Plenty for repeat colonoscopy six months ago which he reports was negative.  He denies hospitalizations or emergency room visits.    PROBLEM LIST:  1. Colon cancer initially diagnosed in 1999 status post resection followed by 6 months of adjuvant 5-FU leucovorin with recurrence 07/2005 with mucinous tumor T4 N0 and T1 N0. Status post resection followed by 2 cycles of FOLFOX and followed by 6 cycles of Xeloda from 10/05/2005 through 06/13/2006. Oxaliplatin was discontinued secondary to debilitating neuropathy.  2. 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  3. Status post bypass graft x4 vessels in 1998.  4. Type 2 diabetes.  5. Hypertension.  MEDICAL HISTORY: Past Medical History  Diagnosis Date  . Urethral meatal stenosis     S/P dilatation (Dr. Geroge Baseman)  . CAD (coronary artery disease)   . Diabetes mellitus     Type 2  . 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 colon ca dx'd 2831/ 2006    colon/recurrent  . Hypertension     INTERIM HISTORY: has HYPERCHOLESTEROLEMIA; ANEMIA, IRON DEFICIENCY; PERNICIOUS ANEMIA; ERECTILE DYSFUNCTION; NEUROPATHY, PERIPHERAL; HYPERTENSION, BENIGN SYSTEMIC;  ARTERIOSCLEROSIS, CORONARY; LUMBAGO; DIABETES MELLITUS, WITH NEUROLOGICAL COMPLICATIONS; Obesity; Colon cancer; Diabetic nephropathy with proteinuria; and CKD stage G3b/A2, GFR 30 - 44 and albumin creatinine ratio 30 - 299 mg/g on his problem list.    ALLERGIES:  is allergic to morphine; lyrica; neurontin; carbamazepine; invokana; nortriptyline hcl; and oxycodone hcl.  MEDICATIONS: has a current medication list which includes the following prescription(s): AMBULATORY NON FORMULARY MEDICATION, aspirin, farxiga, fexofenadine, insulin aspart, insulin detemir, lisinopril, lorazepam, metformin, pravastatin, tramadol, and voltaren.  SURGICAL HISTORY:  Past Surgical History  Procedure Laterality Date  . Cholectomy    . Colon cancer  [phone]    recurrence  . Coronary artery bypass graft    . Refractive surgery      REVIEW OF SYSTEMS:   Constitutional: Denies fevers, chills or abnormal weight loss Eyes: Denies blurriness of vision Ears, nose, mouth, throat, and face: Denies mucositis or sore throat Respiratory: Denies cough, dyspnea or wheezes Cardiovascular: Denies palpitation, chest discomfort or lower extremity swelling Gastrointestinal:  Denies nausea, heartburn or change in bowel habits Skin: Denies abnormal skin rashes Lymphatics: Denies new lymphadenopathy or easy bruising Neurological: He has numbness, tingling in hands and feet which is stable but denies new weaknesses Behavioral/Psych: Mood is stable, no new changes  All other systems were reviewed with the patient and are negative.  PHYSICAL EXAMINATION: ECOG PERFORMANCE STATUS: 0 - Asymptomatic  Blood pressure 117/59, pulse 75, temperature 98.4 F (36.9 C), temperature source Oral, resp. rate 18, height 6\' 2"  (1.88 m), weight 300 lb 1.6 oz (136.124 kg), SpO2 98.00%.  GENERAL:alert, no distress  and comfortable, well developed and well nourished, moderately obese SKIN: skin color, texture, turgor are normal, no rashes or  significant lesions EYES: normal, Conjunctiva are pink and non-injected, sclera clear OROPHARYNX:no exudate, no erythema and lips, buccal mucosa, and tongue normal  NECK: supple, thyroid normal size, non-tender, without nodularity LYMPH:  no palpable lymphadenopathy in the cervical, axillary or supraclavicular LUNGS: clear to auscultation with normal breathing effort HEART: regular rate & rhythm and no murmurs and no lower extremity edema ABDOMEN:abdomen soft, non-tender and normal bowel sounds; scars well hellead Musculoskeletal:no cyanosis of digits and no clubbing  NEURO: alert & oriented x 3 with fluent speech, no focal motor/sensory deficits  Labs:  Lab Results  Component Value Date   WBC 5.7 05/19/2014   HGB 15.4 05/19/2014   HCT 46.5 05/19/2014   MCV 96.4 05/19/2014   PLT 197 05/19/2014   NEUTROABS 3.0 05/19/2014      Chemistry      Component Value Date/Time   NA 138 05/19/2014 0800   NA 139 05/19/2014 0755   NA 137 05/18/2012 0821   K 5.3 05/19/2014 0800   K 4.9 05/19/2014 0755   K 5.1* 05/18/2012 0821   CL 103 05/19/2014 0800   CL 97* 05/18/2012 0821   CO2 27 05/19/2014 0800   CO2 25 05/19/2014 0755   CO2 28 05/18/2012 0821   BUN 26* 05/19/2014 0800   BUN 25.9 05/19/2014 0755   BUN 20 05/18/2012 0821   CREATININE 1.34 05/19/2014 0800   CREATININE 1.4* 05/19/2014 0755   CREATININE 1.19 11/26/2011 0946      Component Value Date/Time   CALCIUM 9.7 05/19/2014 0800   CALCIUM 10.0 05/19/2014 0755   CALCIUM 9.0 05/18/2012 0821   ALKPHOS 50 05/19/2014 0755   ALKPHOS 50 02/17/2014 0807   ALKPHOS 59 05/18/2012 0821   AST 21 05/19/2014 0755   AST 25 02/17/2014 0807   AST 28 05/18/2012 0821   ALT 25 05/19/2014 0755   ALT 29 02/17/2014 0807   ALT 37 05/18/2012 0821   BILITOT 0.59 05/19/2014 0755   BILITOT 0.6 02/17/2014 0807   BILITOT 0.70 05/18/2012 0821     Studies:  No results found. Results for SAAHIL, HERBSTER (MRN [id number]) as of 05/26/2014 09:12  Ref. Range 05/18/2012 08:21 05/23/2013 08:19 11/18/2013  08:08 05/19/2014 07:55  CEA Latest Range: 0.0-5.0 ng/mL 1.8 1.7 2.1 1.8    RADIOGRAPHIC STUDIES: None  ASSESSMENT: Brian Strickland 68 y.o. male with a history of Colon cancer - Plan: Comprehensive metabolic panel (Cmet) - [facility], Lactate dehydrogenase (LDH) - [facility], CBC with Differential, CEA, CT Abdomen Pelvis W Contrast, CT Chest W Contrast  ANEMIA, IRON DEFICIENCY   PLAN:   1. Recurrent Colon Cancer. --Mr. Highfill is a 68 year old man with history of recurrent colon cancer. He is status post resection receiving 6 months of adjuvant oxaliplatin based therapy for 2 cycles. As a result of neuropathy, oxaliplatin was discontinued. He continued on Xeloda.  --His recent CTs and blood work indicate no evidence of recurrence. He receives colonoscopy every 1-2 years with Dr. Amedeo Plenty, last one was on 08/22/2013 and was negative.  Next one in 1-2 years.  We will plan on repeat CT of abdomen and pelvis in January 2016.  We discussed spacing scans out to avoid constrast exposure but encouraged him to report any clinical symptoms such as weight lost, stool changes, etc.    2. Follow-up.  The patient follows up in a clinic visit in 6 months with  CT and labs including CBC, Chemistries and CEA prior to visit.  All questions were answered. The patient knows to call the clinic with any problems, questions or concerns. We can certainly see the patient much sooner if necessary.  I spent 15 minutes counseling the patient face to face. The total time spent in the appointment was 25 minutes.    Carlena Ruybal, MD 05/26/2014 10:13 AM

## 2014-05-26 NOTE — Telephone Encounter (Signed)
gv and printed appt sched and avs for pt for Jan 2016 °

## 2014-08-16 ENCOUNTER — Encounter: Payer: Self-pay | Admitting: Family Medicine

## 2014-08-16 ENCOUNTER — Ambulatory Visit (INDEPENDENT_AMBULATORY_CARE_PROVIDER_SITE_OTHER): Payer: Medicare Other | Admitting: Family Medicine

## 2014-08-16 VITALS — BP 116/71 | HR 93 | Temp 98.2°F | Wt 299.0 lb

## 2014-08-16 DIAGNOSIS — I10 Essential (primary) hypertension: Secondary | ICD-10-CM

## 2014-08-16 DIAGNOSIS — Z23 Encounter for immunization: Secondary | ICD-10-CM

## 2014-08-16 DIAGNOSIS — E1149 Type 2 diabetes mellitus with other diabetic neurological complication: Secondary | ICD-10-CM

## 2014-08-16 DIAGNOSIS — C189 Malignant neoplasm of colon, unspecified: Secondary | ICD-10-CM

## 2014-08-16 LAB — POCT GLYCOSYLATED HEMOGLOBIN (HGB A1C): HEMOGLOBIN A1C: 6.9

## 2014-08-16 LAB — POCT UA - MICROALBUMIN
CREATININE, POC: 50 mg/dL
MICROALBUMIN (UR) POC: 10 mg/L

## 2014-08-16 MED ORDER — PRAVASTATIN SODIUM 40 MG PO TABS: 40.0000 mg | ORAL_TABLET | Freq: Every day | ORAL | Status: DC

## 2014-08-16 NOTE — Assessment & Plan Note (Signed)
Well-controlled. A1c is down to 6.9 today. This is fantastic. He is doing great job. Please call if any problems. Continue work on diet exercise and weight loss. Followup in 3 months. Foot exam performed today. Flu vaccine given today.

## 2014-08-16 NOTE — Progress Notes (Signed)
   Subjective:    Patient ID: Brian Strickland, male    DOB: 11/30/1945, 68 y.o.   MRN: [id number]  HPI Diabetes - no hypoglycemic events. No wounds or sores that are not healing well. No increased thirst or urination. Checking glucose at home. Taking medications as prescribed without any side effects.  Feels like he is eating better.  Says checking his sugar 5 times a day. On baby ASA. He is using 36-37 units on his Levemir.   Hypertension- Pt denies chest pain, SOB, dizziness, or heart palpitations.  Taking meds as directed w/o problems.  Denies medication side effects.     Review of Systems     Objective:   Physical Exam  Constitutional: He is oriented to person, place, and time. He appears well-developed and well-nourished.  HENT:  Head: Normocephalic and atraumatic.  Cardiovascular: Normal rate, regular rhythm and normal heart sounds.   Pulmonary/Chest: Effort normal and breath sounds normal.  Neurological: He is alert and oriented to person, place, and time.  Skin: Skin is warm and dry.  Psychiatric: He has a normal mood and affect. His behavior is normal.          Assessment & Plan:  Flu shot given today.

## 2014-08-16 NOTE — Assessment & Plan Note (Signed)
Well-controlled.  Continue current regimen. 

## 2014-11-02 ENCOUNTER — Telehealth: Payer: Self-pay | Admitting: Hematology

## 2014-11-10 ENCOUNTER — Other Ambulatory Visit: Payer: Self-pay | Admitting: Family Medicine

## 2014-11-10 ENCOUNTER — Encounter: Payer: Self-pay | Admitting: Hematology

## 2014-11-13 ENCOUNTER — Other Ambulatory Visit: Payer: Self-pay | Admitting: Hematology

## 2014-11-13 DIAGNOSIS — C189 Malignant neoplasm of colon, unspecified: Secondary | ICD-10-CM

## 2014-11-13 NOTE — Telephone Encounter (Signed)
Printed for provider review.

## 2014-11-16 ENCOUNTER — Other Ambulatory Visit: Payer: Self-pay | Admitting: *Deleted

## 2014-11-16 DIAGNOSIS — D509 Iron deficiency anemia, unspecified: Secondary | ICD-10-CM

## 2014-11-16 DIAGNOSIS — D510 Vitamin B12 deficiency anemia due to intrinsic factor deficiency: Secondary | ICD-10-CM

## 2014-11-17 ENCOUNTER — Encounter (HOSPITAL_COMMUNITY): Payer: Self-pay

## 2014-11-17 ENCOUNTER — Ambulatory Visit (HOSPITAL_COMMUNITY)
Admission: RE | Admit: 2014-11-17 | Discharge: 2014-11-17 | Disposition: A | Discharge status: Home / Self Care | Payer: Medicare Other | Source: Ambulatory Visit | Attending: Internal Medicine | Admitting: Internal Medicine

## 2014-11-17 ENCOUNTER — Other Ambulatory Visit (HOSPITAL_BASED_OUTPATIENT_CLINIC_OR_DEPARTMENT_OTHER): Payer: Medicare Other

## 2014-11-17 DIAGNOSIS — D510 Vitamin B12 deficiency anemia due to intrinsic factor deficiency: Secondary | ICD-10-CM

## 2014-11-17 DIAGNOSIS — C189 Malignant neoplasm of colon, unspecified: Secondary | ICD-10-CM

## 2014-11-17 DIAGNOSIS — R197 Diarrhea, unspecified: Secondary | ICD-10-CM | POA: Insufficient documentation

## 2014-11-17 DIAGNOSIS — D509 Iron deficiency anemia, unspecified: Secondary | ICD-10-CM

## 2014-11-17 LAB — CBC WITH DIFFERENTIAL/PLATELET
BASO%: 1.1 % (ref 0.0–2.0)
Basophils Absolute: 0.1 10*3/uL (ref 0.0–0.1)
EOS%: 1.4 % (ref 0.0–7.0)
Eosinophils Absolute: 0.1 10*3/uL (ref 0.0–0.5)
HEMATOCRIT: 50.7 % — AB (ref 38.4–49.9)
HGB: 16.3 g/dL (ref 13.0–17.1)
LYMPH%: 31.6 % (ref 14.0–49.0)
MCH: 31.2 pg (ref 27.2–33.4)
MCHC: 32.2 g/dL (ref 32.0–36.0)
MCV: 96.9 fL (ref 79.3–98.0)
MONO#: 0.8 10*3/uL (ref 0.1–0.9)
MONO%: 12.2 % (ref 0.0–14.0)
NEUT#: 3.4 10*3/uL (ref 1.5–6.5)
NEUT%: 53.7 % (ref 39.0–75.0)
Platelets: 197 10*3/uL (ref 140–400)
RBC: 5.24 10*6/uL (ref 4.20–5.82)
RDW: 14.1 % (ref 11.0–14.6)
WBC: 6.4 10*3/uL (ref 4.0–10.3)
lymph#: 2 10*3/uL (ref 0.9–3.3)

## 2014-11-17 LAB — COMPREHENSIVE METABOLIC PANEL (CC13)
ALT: 27 U/L (ref 0–55)
AST: 25 U/L (ref 5–34)
Albumin: 4.1 g/dL (ref 3.5–5.0)
Alkaline Phosphatase: 58 U/L (ref 40–150)
Anion Gap: 11 mEq/L (ref 3–11)
BUN: 21.9 mg/dL (ref 7.0–26.0)
CALCIUM: 9.8 mg/dL (ref 8.4–10.4)
CHLORIDE: 103 meq/L (ref 98–109)
CO2: 26 mEq/L (ref 22–29)
Creatinine: 1.4 mg/dL — ABNORMAL HIGH (ref 0.7–1.3)
EGFR: 51 mL/min/{1.73_m2} — ABNORMAL LOW (ref >90)
Glucose: 170 mg/dl — ABNORMAL HIGH (ref 70–140)
POTASSIUM: 4.3 meq/L (ref 3.5–5.1)
Sodium: 140 mEq/L (ref 136–145)
Total Bilirubin: 0.55 mg/dL (ref 0.20–1.20)
Total Protein: 7.6 g/dL (ref 6.4–8.3)

## 2014-11-17 MED ORDER — IOHEXOL 300 MG/ML  SOLN
100.0000 mL | Freq: Once | INTRAMUSCULAR | Status: AC | PRN
  Administered 2014-11-17: 100 mL via INTRAVENOUS

## 2014-11-18 LAB — CEA: CEA: 1.7 ng/mL (ref 0.0–5.0)

## 2014-11-18 LAB — PSA: PSA: 2.87 ng/mL (ref <=4.00)

## 2014-11-20 ENCOUNTER — Ambulatory Visit (INDEPENDENT_AMBULATORY_CARE_PROVIDER_SITE_OTHER): Payer: Medicare Other | Admitting: Family Medicine

## 2014-11-20 ENCOUNTER — Encounter: Payer: Self-pay | Admitting: Family Medicine

## 2014-11-20 ENCOUNTER — Telehealth: Payer: Self-pay | Admitting: *Deleted

## 2014-11-20 VITALS — BP 110/67 | HR 85 | Ht 74.0 in | Wt 297.0 lb

## 2014-11-20 DIAGNOSIS — I10 Essential (primary) hypertension: Secondary | ICD-10-CM

## 2014-11-20 DIAGNOSIS — E1149 Type 2 diabetes mellitus with other diabetic neurological complication: Secondary | ICD-10-CM

## 2014-11-20 LAB — POCT GLYCOSYLATED HEMOGLOBIN (HGB A1C): Hemoglobin A1C: 7.3

## 2014-11-20 MED ORDER — DICLOFENAC SODIUM 1 % TD GEL: TRANSDERMAL | Status: DC

## 2014-11-20 NOTE — Progress Notes (Signed)
   Subjective:    Patient ID: Brian Strickland, male    DOB: December 18, 1945, 69 y.o.   MRN: [id number]  HPI Hypertension- Pt denies chest pain, SOB, dizziness, or heart palpitations.  Taking meds as directed w/o problems.  Denies medication side effects.    Diabetes - no hypoglycemic events. No wounds or sores that are not healing well. No increased thirst or urination. Checking glucose at home. Taking medications as prescribed without any side effects.last A1C was 6.9. Has eye exam scheduled in Feb. Has been having highs and lows. Hasn't been as active.   Review of Systems     Objective:   Physical Exam  Constitutional: He is oriented to person, place, and time. He appears well-developed and well-nourished.  HENT:  Head: Normocephalic and atraumatic.  Cardiovascular: Normal rate, regular rhythm and normal heart sounds.   Pulmonary/Chest: Effort normal and breath sounds normal.  Neurological: He is alert and oriented to person, place, and time.  Skin: Skin is warm and dry.  Psychiatric: He has a normal mood and affect. His behavior is normal.          Assessment & Plan:  HTN - well controlled. Has lost 2 lbs since las there.    DM- A1C is 7.3 today. Plans on getting on track with diet and exercise to get A1C down. Has had some lows. F/U in 3 months.

## 2014-11-20 NOTE — Telephone Encounter (Signed)
Prior auth for voltaren gel submitted via cover my meds. Awaiting decision.

## 2014-11-22 ENCOUNTER — Encounter: Payer: Self-pay | Admitting: Family Medicine

## 2014-11-22 NOTE — Telephone Encounter (Signed)
Sonia from [facility] called and lm on my voicemail that this was denied due to not being FDA approved and to consider other alternative medication.

## 2014-11-23 ENCOUNTER — Telehealth: Payer: Self-pay | Admitting: *Deleted

## 2014-11-23 NOTE — Telephone Encounter (Signed)
I resubmitted auth for voltaren and called Coy to let him know that if it was denied this time I would send it to a compounding pharmacy in [place]. He agreed with that plan.

## 2014-11-24 ENCOUNTER — Ambulatory Visit: Payer: Medicare Other

## 2014-11-29 ENCOUNTER — Encounter: Payer: Self-pay | Admitting: Hematology

## 2014-11-29 ENCOUNTER — Ambulatory Visit (HOSPITAL_BASED_OUTPATIENT_CLINIC_OR_DEPARTMENT_OTHER): Payer: Medicare Other | Admitting: Hematology

## 2014-11-29 VITALS — BP 112/72 | HR 83 | Temp 98.2°F | Resp 20 | Ht 74.0 in | Wt 299.7 lb

## 2014-11-29 DIAGNOSIS — C189 Malignant neoplasm of colon, unspecified: Secondary | ICD-10-CM

## 2014-11-29 DIAGNOSIS — Z85038 Personal history of other malignant neoplasm of large intestine: Secondary | ICD-10-CM

## 2014-11-29 NOTE — Progress Notes (Signed)
[place], [address] [place] [address]  DIAGNOSIS: Colon cancer  Chief Complaint  Patient presents with  . Follow-up    colon ca    CURRENT THERAPY: Active surveillance.   INTERVAL HISTORY: Brian Strickland 69 y.o. male with a history of colon cancer presents for follow-up. He was last seen by Dr. Juliann Mule 6 month ago. He is doing well overall. He still has neuropathy on feet and fingers which are stable, he takes tramadol and ibuprofen. No impact on her functions. No other complains.    PROBLEM LIST:  1. Colon cancer initially diagnosed in 1999 status post resection followed by 6 months of adjuvant 5-FU leucovorin with recurrence 07/2005 with mucinous tumor T4 N0 and T1 N0. Status post resection followed by 2 cycles of FOLFOX and followed by 6 cycles of Xeloda from 10/05/2005 through 06/13/2006. Oxaliplatin was discontinued secondary to debilitating neuropathy.  2. 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  3. Status post bypass graft x4 vessels in 1998.  4. Type 2 diabetes.  5. Hypertension.  MEDICAL HISTORY: Past Medical History  Diagnosis Date  . Urethral meatal stenosis     S/P dilatation (Dr. Geroge Baseman)  . CAD (coronary artery disease)   . 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 Hypertension   . colon ca dx'd 0093/ 2006    colon/recurrent  . Diabetes mellitus     Type 2     ALLERGIES:  is allergic to morphine; lyrica; neurontin; carbamazepine; invokana; nortriptyline hcl; and oxycodone hcl.  MEDICATIONS: has a current medication list which includes the following prescription(s): AMBULATORY NON FORMULARY MEDICATION, aspirin, diclofenac sodium, farxiga, fexofenadine, insulin aspart, insulin detemir, lisinopril, lorazepam, metformin, pravastatin, and tramadol.  SURGICAL HISTORY:  Past Surgical History  Procedure Laterality Date  .  Cholectomy    . Colon cancer  [phone]    recurrence  . Coronary artery bypass graft    . Refractive surgery      REVIEW OF SYSTEMS:   Constitutional: Denies fevers, chills or abnormal weight loss Eyes: Denies blurriness of vision Ears, nose, mouth, throat, and face: Denies mucositis or sore throat Respiratory: Denies cough, dyspnea or wheezes Cardiovascular: Denies palpitation, chest discomfort or lower extremity swelling Gastrointestinal:  Denies nausea, heartburn or change in bowel habits Skin: Denies abnormal skin rashes Lymphatics: Denies new lymphadenopathy or easy bruising Neurological: He has numbness, tingling in hands and feet which is stable but denies new weaknesses Behavioral/Psych: Mood is stable, no new changes  All other systems were reviewed with the patient and are negative.  PHYSICAL EXAMINATION: ECOG PERFORMANCE STATUS: 0 - Asymptomatic  Blood pressure 112/72, pulse 83, temperature 98.2 F (36.8 C), temperature source Oral, resp. rate 20, height 6\' 2"  (1.88 m), weight 299 lb 11.2 oz (135.943 kg).  GENERAL:alert, no distress and comfortable, well developed and well nourished, moderately obese SKIN: skin color, texture, turgor are normal, no rashes or significant lesions EYES: normal, Conjunctiva are pink and non-injected, sclera clear OROPHARYNX:no exudate, no erythema and lips, buccal mucosa, and tongue normal  NECK: supple, thyroid normal size, non-tender, without nodularity LYMPH:  no palpable lymphadenopathy in the cervical, axillary or supraclavicular LUNGS: clear to auscultation with normal breathing effort HEART: regular rate & rhythm and no murmurs and no lower extremity edema ABDOMEN:abdomen soft, non-tender and normal bowel sounds; scars well hellead Musculoskeletal:no cyanosis of digits and no clubbing  NEURO:  alert & oriented x 3 with fluent speech, no focal motor/sensory deficits  Labs:  Lab Results  Component Value Date   WBC 6.4 11/17/2014    HGB 16.3 11/17/2014   HCT 50.7* 11/17/2014   MCV 96.9 11/17/2014   PLT 197 11/17/2014   NEUTROABS 3.4 11/17/2014      Chemistry      Component Value Date/Time   NA 140 11/17/2014 0750   NA 138 05/19/2014 0800   NA 137 05/18/2012 0821   K 4.3 11/17/2014 0750   K 5.3 05/19/2014 0800   K 5.1* 05/18/2012 0821   CL 103 05/19/2014 0800   CL 97* 05/18/2012 0821   CO2 26 11/17/2014 0750   CO2 27 05/19/2014 0800   CO2 28 05/18/2012 0821   BUN 21.9 11/17/2014 0750   BUN 26* 05/19/2014 0800   BUN 20 05/18/2012 0821   CREATININE 1.4* 11/17/2014 0750   CREATININE 1.34 05/19/2014 0800   CREATININE 1.19 11/26/2011 0946      Component Value Date/Time   CALCIUM 9.8 11/17/2014 0750   CALCIUM 9.7 05/19/2014 0800   CALCIUM 9.0 05/18/2012 0821   ALKPHOS 58 11/17/2014 0750   ALKPHOS 50 02/17/2014 0807   ALKPHOS 59 05/18/2012 0821   AST 25 11/17/2014 0750   AST 25 02/17/2014 0807   AST 28 05/18/2012 0821   ALT 27 11/17/2014 0750   ALT 29 02/17/2014 0807   ALT 37 05/18/2012 0821   BILITOT 0.55 11/17/2014 0750   BILITOT 0.6 02/17/2014 0807   BILITOT 0.70 05/18/2012 0821     Studies:  No results found. Results for ARTHER, HEISLER (MRN [id number]) as of 05/26/2014 09:12  Ref. Range 05/18/2012 08:21 05/23/2013 08:19 11/18/2013 08:08 05/19/2014 07:55  CEA Latest Range: 0.0-5.0 ng/mL 1.8 1.7 2.1 1.8    RADIOGRAPHIC STUDIES: CT chest, abd and pelvis w contrast 11/17/2014 IMPRESSION: 1. Postoperative changes of subtotal colectomy without evidence of recurrent or metastatic disease. 2. Question tiny right renal stone. 3. Small bilateral inguinal hernias contain fat.   ASSESSMENT: Brian Strickland 69 y.o. male with a history of Colon cancer   PLAN:   1. Recurrent Colon Cancer. --Mr. Brian Strickland is a 69 year old man with history of recurrent colon cancer.  -He is now 9-10 years out of his cancer recurrence, the risk of recurrence in the future is very low male. -I reviewed his surveillant CT scan  with him and his wife, which showed no evidence of disease. His physical exam was unremarkable. His CEA level is normal.  -I'll discharge him from my clinic. He can be followed by his primary care physician.   All questions were answered. The patient knows to call the clinic with any problems, questions or concerns. We can certainly see the patient much sooner if necessary.  Plan -follow up with [facility]CP and [facility] doctor.  -I will see you as needed in the future.      I spent 15 minutes counseling the patient face to face. The total time spent in the appointment was 25 minutes.    Truitt Merle, MD 11/29/2014  6:31 PM

## 2014-12-16 NOTE — Telephone Encounter (Signed)
A new request was sent through covermymeds.

## 2015-01-10 ENCOUNTER — Telehealth: Payer: Self-pay

## 2015-01-10 NOTE — Telephone Encounter (Signed)
Spoke with [employer] and Voltaren gel was denied due to not being prescribed for an FDA Indication. - CF

## 2015-01-10 NOTE — Telephone Encounter (Signed)
PA denied.

## 2015-01-18 ENCOUNTER — Telehealth: Payer: Self-pay

## 2015-01-18 NOTE — Telephone Encounter (Signed)
Patient states he does not need to. He had an infection last week. He states he took Cipro and this took care of the UTI. He states he keeps a prescription on hand with refills.

## 2015-01-18 NOTE — Telephone Encounter (Signed)
The pharmacy called and wanted an ok to refill Cipro 500 mg 1 po bid # 14 for 7 days with refills. This medication is not on current medication list. Please advise.

## 2015-01-18 NOTE — Telephone Encounter (Signed)
See if he can come in for a urine sample today

## 2015-01-19 NOTE — Telephone Encounter (Signed)
Ok to refill 

## 2015-01-20 MED ORDER — CIPROFLOXACIN HCL 500 MG PO TABS: 500.0000 mg | ORAL_TABLET | Freq: Two times a day (BID) | ORAL | Status: DC

## 2015-01-20 NOTE — Telephone Encounter (Signed)
Medication sent.

## 2015-02-19 ENCOUNTER — Ambulatory Visit: Payer: Medicare Other | Admitting: Family Medicine

## 2015-02-20 ENCOUNTER — Encounter: Payer: Self-pay | Admitting: Family Medicine

## 2015-02-20 ENCOUNTER — Ambulatory Visit (INDEPENDENT_AMBULATORY_CARE_PROVIDER_SITE_OTHER): Payer: Medicare Other | Admitting: Family Medicine

## 2015-02-20 VITALS — BP 97/67 | HR 93 | Ht 74.0 in | Wt 299.0 lb

## 2015-02-20 DIAGNOSIS — E1149 Type 2 diabetes mellitus with other diabetic neurological complication: Secondary | ICD-10-CM | POA: Diagnosis not present

## 2015-02-20 DIAGNOSIS — N183 Chronic kidney disease, stage 3 (moderate): Secondary | ICD-10-CM | POA: Diagnosis not present

## 2015-02-20 DIAGNOSIS — I10 Essential (primary) hypertension: Secondary | ICD-10-CM

## 2015-02-20 DIAGNOSIS — N390 Urinary tract infection, site not specified: Secondary | ICD-10-CM

## 2015-02-20 DIAGNOSIS — N1832 Chronic kidney disease, stage 3b: Secondary | ICD-10-CM

## 2015-02-20 LAB — POCT GLYCOSYLATED HEMOGLOBIN (HGB A1C): Hemoglobin A1C: 7.2

## 2015-02-20 MED ORDER — CIPROFLOXACIN HCL 500 MG PO TABS
500.0000 mg | ORAL_TABLET | Freq: Two times a day (BID) | ORAL | Status: DC
Start: 2015-02-20 — End: 2015-05-22

## 2015-02-20 NOTE — Progress Notes (Signed)
   Subjective:    Patient ID: Brian Strickland, male    DOB: 1946-05-11, 69 y.o.   MRN: [id number]  HPI Diabetes - no hypoglycemic events. No wounds or sores that are not healing well. No increased thirst or urination. Checking glucose at home. Taking medications as prescribed without any side effects. On Farxiga. Continue SSI.  Had a UTI about 5 weeks ago.  First one in 14-15 months.  Used Cipro.    Hypertension- Pt denies chest pain, SOB, dizziness, or heart palpitations.  Taking meds as directed w/o problems.  Denies medication side effects.    CKD3/ Hx of recurrent UTI-  Had to use his Cipro. Would like refill. See note above.   Review of Systems     Objective:   Physical Exam  Constitutional: He is oriented to person, place, and time. He appears well-developed and well-nourished.  HENT:  Head: Normocephalic and atraumatic.  Cardiovascular: Normal rate, regular rhythm and normal heart sounds.   Pulmonary/Chest: Effort normal and breath sounds normal.  Neurological: He is alert and oriented to person, place, and time.  Skin: Skin is warm and dry.  Psychiatric: He has a normal mood and affect. His behavior is normal.          Assessment & Plan:  DM- increase levemir to 40 units BID.  Uncontrolled. Down a little.  On ACE and statin. F/U in 3 months.  Gets meds at the [place]. Continue to work on walking.    HTN - well controled. contrinue current regimen.   CKD 3 - Due tor echeck kidneys at f/U in 3 months. He will labs done at tthe [place].

## 2015-05-22 ENCOUNTER — Encounter: Payer: Self-pay | Admitting: Family Medicine

## 2015-05-22 ENCOUNTER — Ambulatory Visit (INDEPENDENT_AMBULATORY_CARE_PROVIDER_SITE_OTHER): Payer: Medicare Other | Admitting: Family Medicine

## 2015-05-22 VITALS — BP 104/62 | HR 88 | Ht 74.0 in | Wt 298.0 lb

## 2015-05-22 DIAGNOSIS — E7800 Pure hypercholesterolemia, unspecified: Secondary | ICD-10-CM

## 2015-05-22 DIAGNOSIS — I10 Essential (primary) hypertension: Secondary | ICD-10-CM

## 2015-05-22 DIAGNOSIS — E1121 Type 2 diabetes mellitus with diabetic nephropathy: Secondary | ICD-10-CM

## 2015-05-22 DIAGNOSIS — E1149 Type 2 diabetes mellitus with other diabetic neurological complication: Secondary | ICD-10-CM | POA: Diagnosis not present

## 2015-05-22 DIAGNOSIS — N183 Chronic kidney disease, stage 3 (moderate): Secondary | ICD-10-CM

## 2015-05-22 DIAGNOSIS — E1129 Type 2 diabetes mellitus with other diabetic kidney complication: Secondary | ICD-10-CM | POA: Diagnosis not present

## 2015-05-22 DIAGNOSIS — Z1159 Encounter for screening for other viral diseases: Secondary | ICD-10-CM

## 2015-05-22 DIAGNOSIS — N1832 Chronic kidney disease, stage 3b: Secondary | ICD-10-CM

## 2015-05-22 LAB — POCT GLYCOSYLATED HEMOGLOBIN (HGB A1C): Hemoglobin A1C: 7.2

## 2015-05-22 MED ORDER — CIPROFLOXACIN HCL 500 MG PO TABS: 500.0000 mg | ORAL_TABLET | Freq: Two times a day (BID) | ORAL | Status: DC

## 2015-05-22 NOTE — Progress Notes (Signed)
   Subjective:    Patient ID: Brian Strickland, male    DOB: December 04, 1945, 69 y.o.   MRN: [id number]  HPI Diabetes - no hypoglycemic events. No wounds or sores that are not healing well. No increased thirst or urination. Checking glucose at home. Taking medications as prescribed without any side effects. On levemir 40 units BID.    CKD 3 - no change in urinartion.    Hypertension- Pt denies chest pain, SOB, dizziness, or heart palpitations.  Taking meds as directed w/o problems.  Denies medication side effects.    Hyperlipidemia-currently taking pravastatin. Tolerating it well without any side effects or myalgias.  Review of Systems     Objective:   Physical Exam  Constitutional: He is oriented to person, place, and time. He appears well-developed and well-nourished.  HENT:  Head: Normocephalic and atraumatic.  Cardiovascular: Normal rate, regular rhythm and normal heart sounds.   Pulmonary/Chest: Effort normal and breath sounds normal.  Neurological: He is alert and oriented to person, place, and time.  Skin: Skin is warm and dry.  Psychiatric: He has a normal mood and affect. His behavior is normal.          Assessment & Plan:  Diabetes with nephropathy-  Well controlled. Due for BMP and lipid panel. On statin.  Increase levemir 42 units BID. F/U in 3 months. Continue to work on diet and exercise and weight loss. This will make a dramatic impact on his diabetes.   CKD 3- Due to recheck.    Hypertension- well controlled. A little on the low side.  Says he is asymptomic.   Hyperlipidemia-due to recheck lipid panel. Lab slip provided today.  Recurrent UTI-refilled his Cipro today. Last episode was a couple of weeks ago.

## 2015-05-23 LAB — BASIC METABOLIC PANEL
BUN: 24 mg/dL — AB (ref 6–23)
CO2: 27 mEq/L (ref 19–32)
Calcium: 10 mg/dL (ref 8.4–10.5)
Chloride: 101 mEq/L (ref 96–112)
Creat: 1.23 mg/dL (ref 0.50–1.35)
GLUCOSE: 157 mg/dL — AB (ref 70–99)
POTASSIUM: 5.1 meq/L (ref 3.5–5.3)
Sodium: 140 mEq/L (ref 135–145)

## 2015-05-23 LAB — HEPATITIS C ANTIBODY: HCV AB: NEGATIVE

## 2015-05-23 LAB — LIPID PANEL
CHOL/HDL RATIO: 3.8 ratio
Cholesterol: 155 mg/dL (ref 0–200)
HDL: 41 mg/dL (ref >=40)
LDL Cholesterol: 88 mg/dL (ref 0–99)
TRIGLYCERIDES: 128 mg/dL (ref <150)
VLDL: 26 mg/dL (ref 0–40)

## 2015-05-29 LAB — LIPID PANEL
Cholesterol: 155 mg/dL (ref 0–200)
HDL: 38 mg/dL (ref 35–70)
LDL CALC: 72 mg/dL
Triglycerides: 223 mg/dL — AB (ref 40–160)

## 2015-05-29 LAB — BASIC METABOLIC PANEL
Creatinine: 1.4 mg/dL — AB (ref 0.6–1.3)
POTASSIUM: 5 mmol/L (ref 3.4–5.3)
SODIUM: 142 mmol/L (ref 137–147)

## 2015-05-29 LAB — HEMOGLOBIN A1C: HEMOGLOBIN A1C: 7.3 % — AB (ref 4.0–6.0)

## 2015-05-29 LAB — MICROALBUMIN, URINE: MICROALB UR: 0.706

## 2015-05-29 LAB — HEPATIC FUNCTION PANEL
ALK PHOS: 61 U/L (ref 25–125)
ALT: 37 U/L (ref 10–40)
AST: 25 U/L (ref 14–40)

## 2015-05-30 ENCOUNTER — Telehealth: Payer: Self-pay | Admitting: Family Medicine

## 2015-05-30 ENCOUNTER — Other Ambulatory Visit: Payer: Self-pay | Admitting: Family Medicine

## 2015-05-30 ENCOUNTER — Encounter: Payer: Self-pay | Admitting: Family Medicine

## 2015-05-30 MED ORDER — EMPAGLIFLOZIN 10 MG PO TABS: 10.0000 mg | ORAL_TABLET | Freq: Every day | ORAL | Status: DC

## 2015-05-30 NOTE — Telephone Encounter (Signed)
Received fax from pharmacy for prior authorization on Jardiance 10 mg Tablets sent through cover my meds waiting on authorization. - CF

## 2015-05-31 NOTE — Telephone Encounter (Signed)
Fax sent for review to [facility] for non-formulary exception form.Brian Strickland

## 2015-06-12 MED ORDER — ALOGLIPTIN BENZOATE 25 MG PO TABS: 1.0000 | ORAL_TABLET | Freq: Every day | ORAL | Status: DC

## 2015-06-12 NOTE — Telephone Encounter (Signed)
jardiance was not covered and farxiga is going to cost almost 300 for 90 days supply.  Would he be able to get through the [place]. Can go up on Levemir to 50 units BID and we can try adding a DDP inhibitor. One of them just went generic. I will send this to Kristopher Oppenheim.

## 2015-06-12 NOTE — Telephone Encounter (Signed)
Called pt he stated that he is currently taking the Jardiance this is working well and that the [place] only provides certain DM meds.Brian Strickland [place]

## 2015-06-14 NOTE — Telephone Encounter (Signed)
I called [employer] and spoke to [employer]. She stated that jardiance is approved from 05/31/2015 - 05/30/2016. - CF

## 2015-06-25 ENCOUNTER — Encounter: Payer: Self-pay | Admitting: Family Medicine

## 2015-06-25 MED ORDER — EMPAGLIFLOZIN 10 MG PO TABS: 10.0000 mg | ORAL_TABLET | Freq: Every day | ORAL | Status: DC

## 2015-07-26 ENCOUNTER — Encounter: Payer: Self-pay | Admitting: Family Medicine

## 2015-08-22 ENCOUNTER — Encounter: Payer: Self-pay | Admitting: Family Medicine

## 2015-08-22 ENCOUNTER — Ambulatory Visit (INDEPENDENT_AMBULATORY_CARE_PROVIDER_SITE_OTHER): Payer: Medicare Other | Admitting: Family Medicine

## 2015-08-22 VITALS — BP 104/70 | HR 94 | Temp 98.7°F | Resp 18 | Wt 298.7 lb

## 2015-08-22 DIAGNOSIS — I10 Essential (primary) hypertension: Secondary | ICD-10-CM

## 2015-08-22 DIAGNOSIS — E1149 Type 2 diabetes mellitus with other diabetic neurological complication: Secondary | ICD-10-CM | POA: Diagnosis not present

## 2015-08-22 DIAGNOSIS — J309 Allergic rhinitis, unspecified: Secondary | ICD-10-CM

## 2015-08-22 DIAGNOSIS — Z23 Encounter for immunization: Secondary | ICD-10-CM | POA: Diagnosis not present

## 2015-08-22 LAB — POCT UA - MICROALBUMIN
Albumin/Creatinine Ratio, Urine, POC: <30
Creatinine, POC: 200 mg/dL
MICROALBUMIN (UR) POC: 30 mg/L

## 2015-08-22 LAB — POCT GLYCOSYLATED HEMOGLOBIN (HGB A1C): Hemoglobin A1C: 7

## 2015-08-22 NOTE — Patient Instructions (Signed)
Can try flonase if needed for your allergies. Can use 1 squirt in each nostril at bedtime.

## 2015-08-22 NOTE — Progress Notes (Signed)
Subjective:    Patient ID: Brian Strickland, male    DOB: 01-May-1946, 69 y.o.   MRN: [id number]  HPI Diabetes - no hypoglycemic events. No wounds or sores that are not healing well. No increased thirst or urination. Checking glucose at home. Taking medications as prescribed without any side effects.  Hypertension- Pt denies chest pain, SOB, dizziness, or heart palpitations.  Taking meds as directed w/o problems.  Denies medication side effects.    Congestion in AM with slight cough. Feels like seasonal allergies.    He saw his ophthalmologist at the [facility] and they recommended a carotid ultrasound. He had that done and says the results were negative which is very reassuring. He will fill out forms to have that released to [place].  Review of Systems  BP 104/70 mmHg  Pulse 94  Temp(Src) 98.7 F (37.1 C) (Oral)  Resp 18  Wt 298 lb 11.2 oz (135.489 kg)  SpO2 98%    Allergies  Allergen Reactions  . Morphine Other (See Comments)    Confusion and not cooperative  . Lyrica [Pregabalin] Other (See Comments)    Personality changes  . Neurontin [Gabapentin] Other (See Comments)    Personality changes  . Carbamazepine     REACTION: irritability  . Invokana [Canagliflozin] Other (See Comments)    Back Pain  . Nortriptyline Hcl     REACTION: confusion  . Oxycodone Hcl     confusion    Past Medical History  Diagnosis Date  . Urethral meatal stenosis     S/P dilatation (Dr. Geroge Baseman)  . CAD (coronary artery disease)   . 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 Hypertension   . colon ca dx'd 9163/ 2006    colon/recurrent  . Diabetes mellitus     Type 2    Past Surgical History  Procedure Laterality Date  . Cholectomy    . Colon cancer  [phone]    recurrence  . Coronary artery bypass graft    . Refractive surgery      Social History   Social History  . Marital Status: Married    Spouse Name: N/A  . Number of Children: N/A  . Years of  Education: N/A   Occupational History  . Not on file.   Social History Main Topics  . Smoking status: Never Smoker   . Smokeless tobacco: Not on file  . Alcohol Use: Yes     Comment: Socially  . Drug Use: No  . 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   Other Topics Concern  . Not on file   Social History Narrative    Family History  Problem Relation Age of Onset  . Cancer      colon/family history    Outpatient Encounter Prescriptions as of 08/22/2015  Medication Sig  . Alogliptin Benzoate 25 MG TABS Take 1 tablet by mouth daily.  . AMBULATORY NON FORMULARY MEDICATION Glucometer of patient and insurance choice.  Diagnosis: Diabetes  . aspirin 81 MG tablet Take 81 mg by mouth daily.    . ciprofloxacin (CIPRO) 500 MG tablet Take 1 tablet (500 mg total) by mouth 2 (two) times daily.  . diclofenac sodium (VOLTAREN) 1 % GEL APPLY TOPICALLY 4 (FOUR) TIMES DAILY.  Marland Kitchen empagliflozin (JARDIANCE) 10 MG TABS tablet Take 10 mg by  mouth daily.  . fexofenadine (ALLEGRA) 180 MG tablet Take 180 mg by mouth as needed.  . insulin aspart (NOVOLOG FLEXPEN) 100 UNIT/ML injection SSI TID with Meals.  . insulin detemir (LEVEMIR) 100 UNIT/ML injection Inject 40 Units into the skin 2 (two) times daily.   Marland Kitchen lisinopril (PRINIVIL,ZESTRIL) 20 MG tablet Take 20 mg by mouth daily.   Marland Kitchen LORazepam (ATIVAN) 1 MG tablet Take 1 tablet (1 mg total) by mouth 2 (two) times daily as needed.  . metFORMIN (GLUCOPHAGE) 500 MG tablet Take 500 mg by mouth 2 (two) times daily with a meal.  . pravastatin (PRAVACHOL) 40 MG tablet Take 1 tablet (40 mg total) by mouth daily.  . traMADol (ULTRAM) 50 MG tablet Take 2 tabs po bid   No facility-administered encounter medications on file as of 08/22/2015.          Objective:   Physical Exam  Constitutional: He is oriented to person, place, and time. He  appears well-developed and well-nourished.  HENT:  Head: Normocephalic and atraumatic.  Cardiovascular: Normal rate, regular rhythm and normal heart sounds.   Pulmonary/Chest: Effort normal and breath sounds normal.  Neurological: He is alert and oriented to person, place, and time.  Skin: Skin is warm and dry.  Psychiatric: He has a normal mood and affect. His behavior is normal.          Assessment & Plan:  DM- A1c looks much better today at 7.0. Continue current regimen and continue to work on regular exercise and weight loss. Follow-up in 3 months. Flu vaccine given today.  HTN - well controlled continue current regimen. Lab work is up-to-date.  AR- can add nasal steroid spray at bedtime to his anti-histamine or the season. Call if feels like is getting worse or running a fever or starts to experience a lot of facial pain or pressure.Marland Kitchen

## 2015-10-15 ENCOUNTER — Encounter: Payer: Self-pay | Admitting: Emergency Medicine

## 2015-10-15 LAB — CREATININE, URINE, RANDOM
CALCIUM: 9.3 mg/dL
CHLORIDE: 107 mmol/L
CREATRANDUR: 134

## 2015-10-17 ENCOUNTER — Encounter: Payer: Self-pay | Admitting: Family Medicine

## 2015-11-11 ENCOUNTER — Other Ambulatory Visit: Payer: Self-pay | Admitting: Family Medicine

## 2015-11-18 ENCOUNTER — Encounter: Payer: Self-pay | Admitting: Family Medicine

## 2015-11-22 ENCOUNTER — Other Ambulatory Visit: Payer: Self-pay | Admitting: Family Medicine

## 2015-11-22 ENCOUNTER — Ambulatory Visit (INDEPENDENT_AMBULATORY_CARE_PROVIDER_SITE_OTHER): Payer: Medicare Other | Admitting: Family Medicine

## 2015-11-22 ENCOUNTER — Encounter: Payer: Self-pay | Admitting: Family Medicine

## 2015-11-22 VITALS — BP 117/54 | HR 95 | Ht 74.0 in | Wt 298.0 lb

## 2015-11-22 DIAGNOSIS — N1832 Chronic kidney disease, stage 3b: Secondary | ICD-10-CM

## 2015-11-22 DIAGNOSIS — E1149 Type 2 diabetes mellitus with other diabetic neurological complication: Secondary | ICD-10-CM

## 2015-11-22 DIAGNOSIS — I10 Essential (primary) hypertension: Secondary | ICD-10-CM | POA: Diagnosis not present

## 2015-11-22 DIAGNOSIS — Z125 Encounter for screening for malignant neoplasm of prostate: Secondary | ICD-10-CM

## 2015-11-22 DIAGNOSIS — N183 Chronic kidney disease, stage 3 (moderate): Secondary | ICD-10-CM | POA: Diagnosis not present

## 2015-11-22 LAB — COMPLETE METABOLIC PANEL WITH GFR
ALBUMIN: 4.3 g/dL (ref 3.6–5.1)
ALT: 22 U/L (ref 9–46)
AST: 23 U/L (ref 10–35)
Alkaline Phosphatase: 50 U/L (ref 40–115)
BUN: 30 mg/dL — ABNORMAL HIGH (ref 7–25)
CALCIUM: 9.6 mg/dL (ref 8.6–10.3)
CHLORIDE: 102 mmol/L (ref 98–110)
CO2: 26 mmol/L (ref 20–31)
CREATININE: 1.35 mg/dL — AB (ref 0.70–1.18)
GFR, EST AFRICAN AMERICAN: 61 mL/min (ref >=60)
GFR, Est Non African American: 53 mL/min — ABNORMAL LOW (ref >=60)
Glucose, Bld: 155 mg/dL — ABNORMAL HIGH (ref 65–99)
POTASSIUM: 5.2 mmol/L (ref 3.5–5.3)
Sodium: 137 mmol/L (ref 135–146)
Total Bilirubin: 0.6 mg/dL (ref 0.2–1.2)
Total Protein: 7.4 g/dL (ref 6.1–8.1)

## 2015-11-22 LAB — POCT GLYCOSYLATED HEMOGLOBIN (HGB A1C): Hemoglobin A1C: 7.2

## 2015-11-22 MED ORDER — METFORMIN HCL 500 MG PO TABS: 500.0000 mg | ORAL_TABLET | Freq: Two times a day (BID) | ORAL | Status: DC

## 2015-11-22 NOTE — Progress Notes (Signed)
   Subjective:    Patient ID: Brian Strickland, male    DOB: 11/27/1945, 70 y.o.   MRN: [id number]  HPI Diabetes - no hypoglycemic events. No wounds or sores that are not healing well. No increased thirst or urination. Checking glucose at home. Taking medications as prescribed without any side effects. He ran our ot [place] for about a week.  He admits he has not eaten in the past as well over the holidays but plans on getting back on track. Using 40 units of Levemir twice a day at 32 units of NovoLog per day. He is on Jardiance, metformin.  CKD 3 - no change in urination.    Hypertension- Pt denies chest pain, SOB, dizziness, or heart palpitations.  Taking meds as directed w/o problems.  Denies medication side effects.     Review of Systems     Objective:   Physical Exam  Constitutional: He is oriented to person, place, and time. He appears well-developed and well-nourished.  HENT:  Head: Normocephalic and atraumatic.  Cardiovascular: Normal rate, regular rhythm and normal heart sounds.   Pulmonary/Chest: Effort normal and breath sounds normal.  Neurological: He is alert and oriented to person, place, and time.  Skin: Skin is warm and dry.  Psychiatric: He has a normal mood and affect. His behavior is normal.          Assessment & Plan:  DM- uncontrolled. Will try ot increase metformin to 1000mg  daily. He will try taking 2 of th e500mg  tabs first to see if he tolerates it well. F/U in 3 months.  Typically get his A1c down from 7.2 to hopefully under 7 if he tolerates it well. In addition to getting back on track with diet and exercise. He is on a statin and aspirin daily. He is also on an ACE inhibitor.  HTN - well controlled.  Continue current regimen.  CKD 3- due to recheck today.     CAD- on statin, aspirin. I see him back consider switching from pravastatin to atorvastatin.  Cancer screening-due for PSA.

## 2015-11-23 ENCOUNTER — Other Ambulatory Visit: Payer: Self-pay | Admitting: *Deleted

## 2015-11-23 ENCOUNTER — Encounter: Payer: Self-pay | Admitting: Family Medicine

## 2015-11-23 LAB — PSA: PSA: 3.5 ng/mL (ref <=4.00)

## 2015-11-23 MED ORDER — LORAZEPAM 1 MG PO TABS: 1.0000 mg | ORAL_TABLET | Freq: Two times a day (BID) | ORAL | Status: DC | PRN

## 2015-11-23 NOTE — Telephone Encounter (Signed)
Rx was filled. Pt advised.

## 2016-01-22 IMAGING — CT CT CHEST W/ CM
2 of 5 series · 17 of 46 positions shown, 19 images · IV contrast (omnipaque)
Comparison: 05/23/2013.

CLINICAL DATA: Diarrhea, history of colon cancer.

EXAM:
CT CHEST, ABDOMEN, AND PELVIS WITH CONTRAST
TECHNIQUE: Multidetector CT imaging of the chest, abdomen and pelvis was
performed following the standard protocol during bolus
administration of intravenous contrast.
CONTRAST:  100mL OMNIPAQUE IOHEXOL 300 MG/ML  SOLN

[Series 2: cap with st · axial · 0.87mm/px · z∈[-728,-133]mm · 14 of 135 slices shown, 16 images]
[im 8/135  soft-tissue]
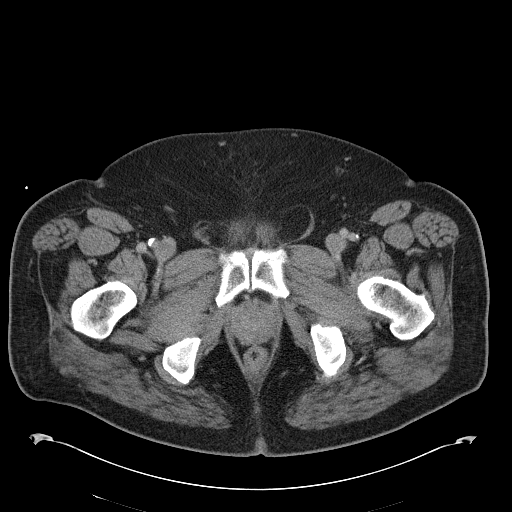
[im 8/135  bone]
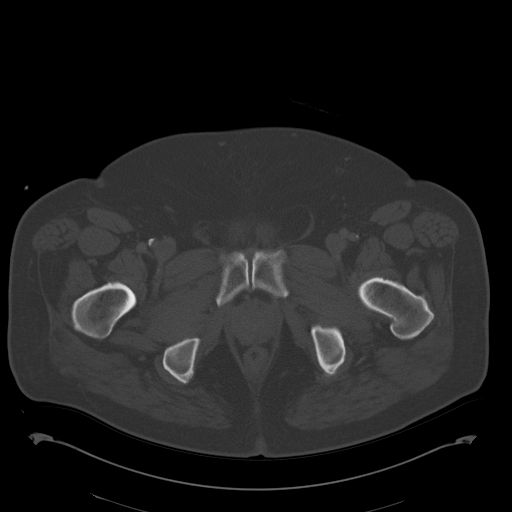
[im 16/135  soft-tissue]
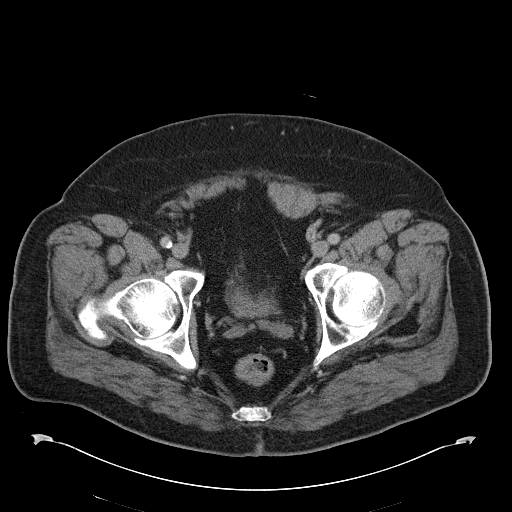
[im 24/135  soft-tissue]
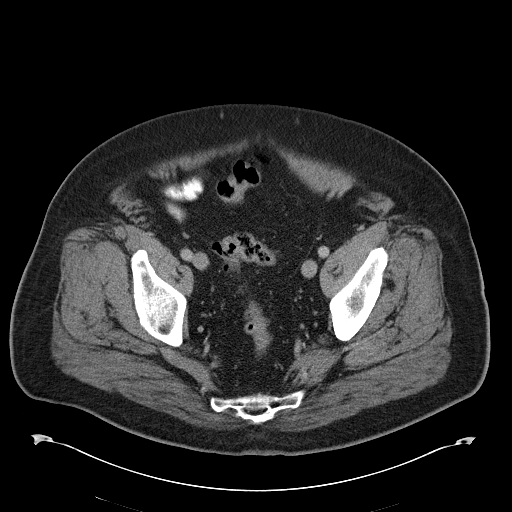
[im 40/135  soft-tissue]
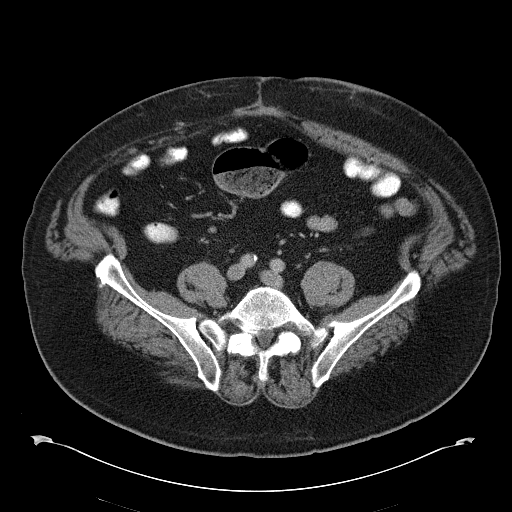
[im 48/135  soft-tissue]
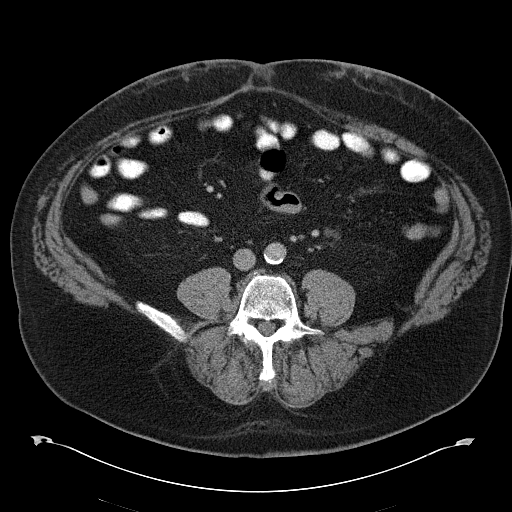
[im 56/135  soft-tissue]
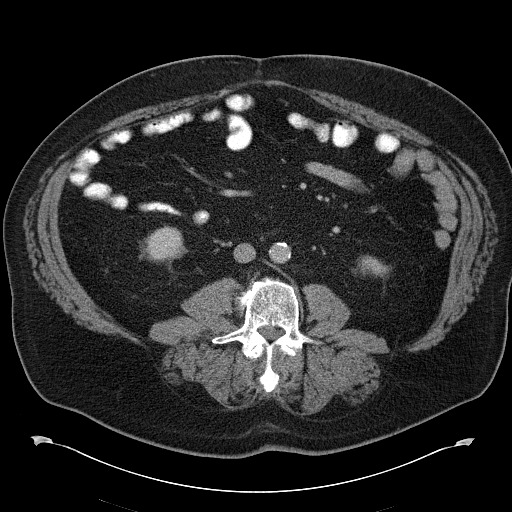
[im 64/135  soft-tissue]
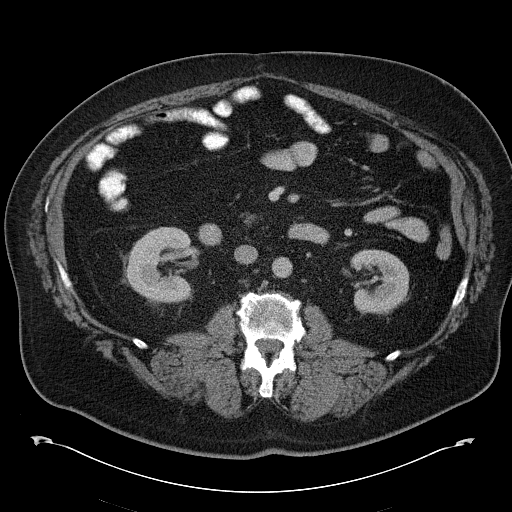
[im 71/135  soft-tissue]
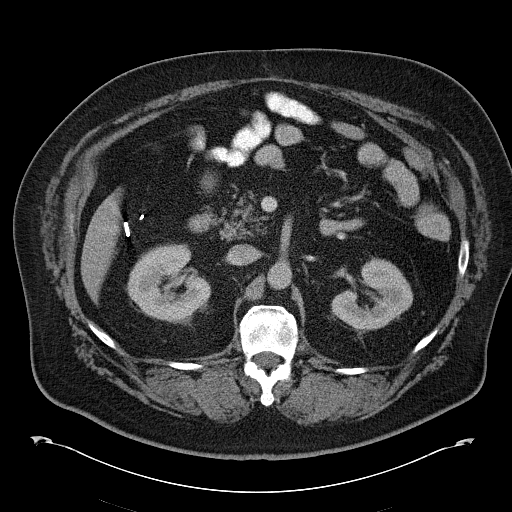
[im 79/135  soft-tissue]
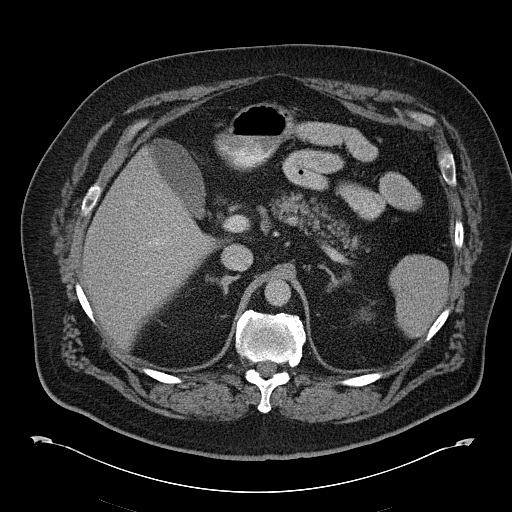
[im 79/135  bone]
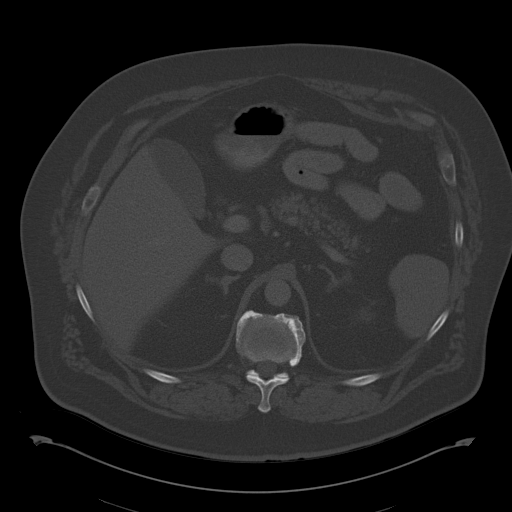
[im 87/135  soft-tissue]
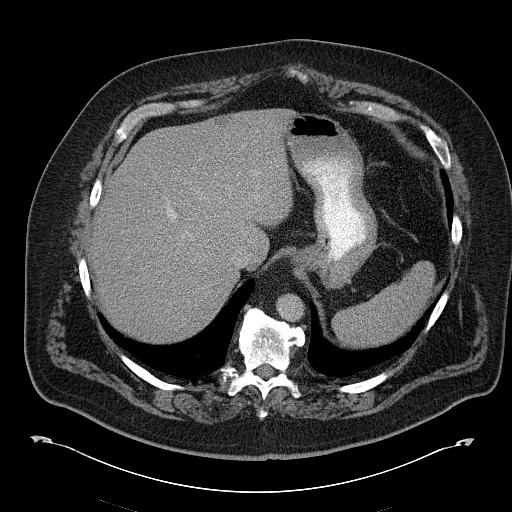
[im 103/135  soft-tissue]
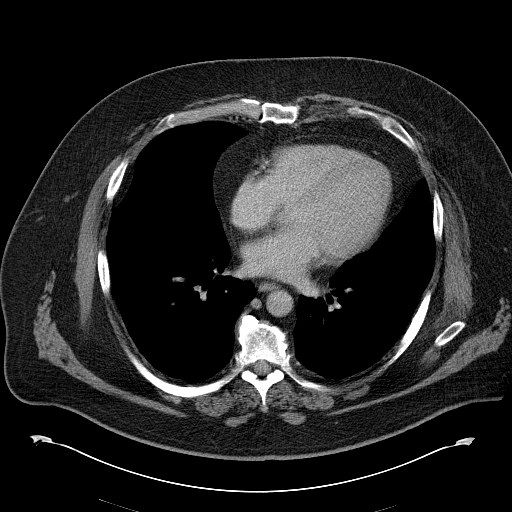
[im 111/135  soft-tissue]
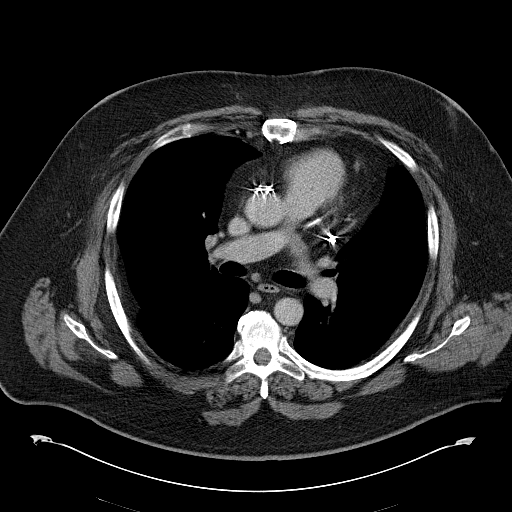
[im 119/135  soft-tissue]
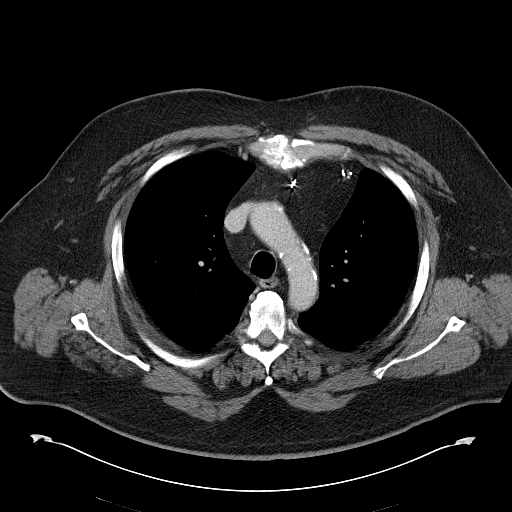
[im 127/135  soft-tissue]
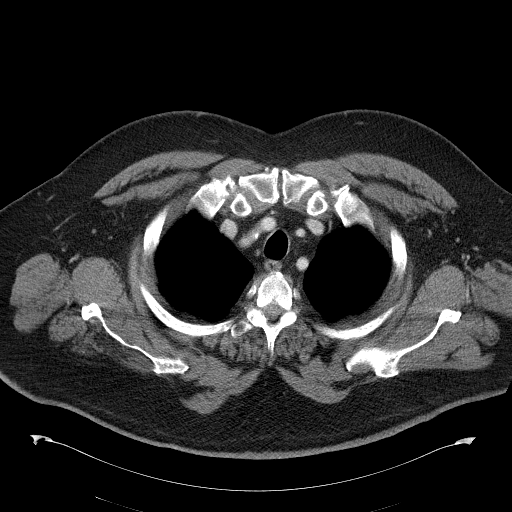

[Series 602: <mpr thick range> · coronal · 1.32mm/px · 3 of 119 slices shown]
[im 40/119  soft-tissue]
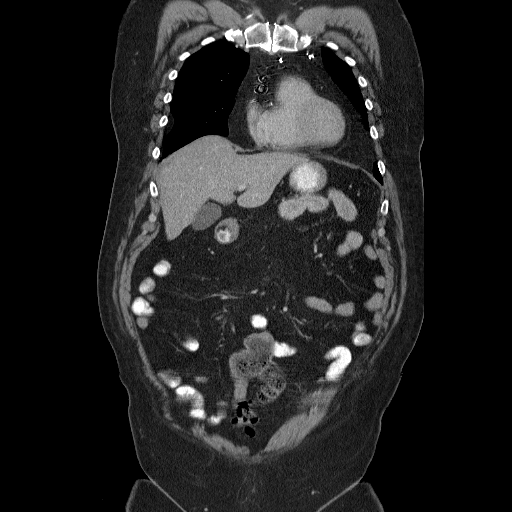
[im 53/119  soft-tissue]
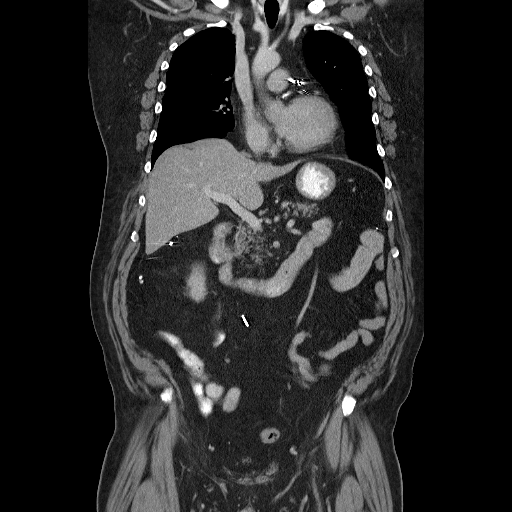
[im 66/119  soft-tissue]
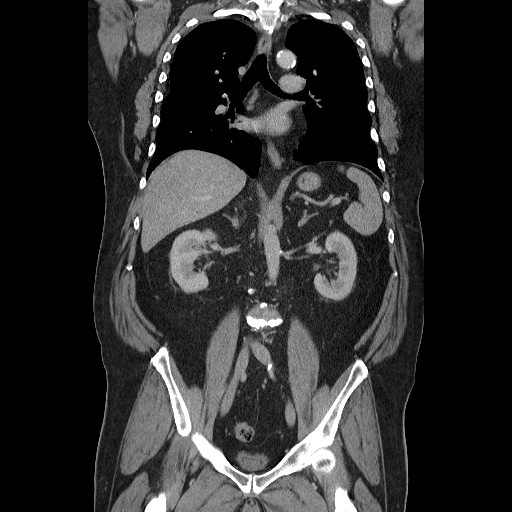

[17 of 46 positions shown; findings below may reference images not displayed]

FINDINGS: CT CHEST FINDINGS

No pathologically enlarged mediastinal, hilar or axillary lymph
nodes. Heart is at the upper limits of normal in size. No
pericardial effusion.

Scattered calcified granulomas. Lungs are otherwise clear. No
pleural fluid. Airway is unremarkable.

CT ABDOMEN AND PELVIS FINDINGS

Hepatobiliary: Liver and gallbladder are unremarkable. No biliary
ductal dilatation.

Pancreas: Negative.

Spleen: Negative.

Adrenals/Urinary Tract: Adrenal glands are unremarkable. There may
be a tiny stone in the lower pole right kidney. Kidneys are
otherwise unremarkable. Ureters are decompressed. Bladder is low in
volume.

Stomach/Bowel: Stomach and small bowel are unremarkable. Patient is
status post subtotal colectomy. Residual colon is unremarkable.

Vascular/Lymphatic: Atherosclerotic calcification of the arterial
vasculature without abdominal aortic aneurysm. No pathologically
enlarged lymph nodes.

Reproductive: Prostate is normal in size.

Other: Small bilateral inguinal hernias contain fat. No free fluid.
Surgical clips in the small bowel mesentery. Mesenteries and
peritoneum are otherwise unremarkable.

Musculoskeletal: No worrisome lytic or sclerotic lesions.
Degenerative changes are seen in the spine.
IMPRESSION: 1. Postoperative changes of subtotal colectomy without evidence of
recurrent or metastatic disease.
2. Question tiny right renal stone.
3. Small bilateral inguinal hernias contain fat.

## 2016-02-20 ENCOUNTER — Ambulatory Visit (INDEPENDENT_AMBULATORY_CARE_PROVIDER_SITE_OTHER): Payer: Medicare Other | Admitting: Family Medicine

## 2016-02-20 ENCOUNTER — Encounter: Payer: Self-pay | Admitting: Family Medicine

## 2016-02-20 VITALS — BP 93/50 | HR 85 | Ht 74.0 in | Wt 294.0 lb

## 2016-02-20 DIAGNOSIS — E1149 Type 2 diabetes mellitus with other diabetic neurological complication: Secondary | ICD-10-CM

## 2016-02-20 DIAGNOSIS — I10 Essential (primary) hypertension: Secondary | ICD-10-CM | POA: Diagnosis not present

## 2016-02-20 LAB — POCT GLYCOSYLATED HEMOGLOBIN (HGB A1C): Hemoglobin A1C: 7.1

## 2016-02-20 MED ORDER — METFORMIN HCL 1000 MG PO TABS: 1000.0000 mg | ORAL_TABLET | Freq: Two times a day (BID) | ORAL | Status: DC

## 2016-02-20 NOTE — Progress Notes (Addendum)
   Subjective:    Patient ID: Brian Strickland, male    DOB: Jun 23, 1946, 70 y.o.   MRN: [id number]  HPI Diabetes - no hypoglycemic events. No wounds or sores that are not healing well. No increased thirst or urination. Checking glucose at home. Taking medications as prescribed without any side effects. He has lost 4 pounds which is fantastic.He does report he had his eye exam performed at the [place] about 2 weeks ago. He will try to get copy of that. He was diagnosed with ptosis of the left eyelid and is actually having surgery on Friday for repair. He did have some some concerns today about his Jardiance. A 90 day supply cost and $612.  Hypertension- Pt denies chest pain, SOB, dizziness, or heart palpitations.  Taking meds as directed w/o problems.  Denies medication side effects.    Review of Systems     Objective:   Physical Exam  Constitutional: He is oriented to person, place, and time. He appears well-developed and well-nourished.  HENT:  Head: Normocephalic and atraumatic.  Cardiovascular: Normal rate, regular rhythm and normal heart sounds.   Pulmonary/Chest: Effort normal and breath sounds normal.  Neurological: He is alert and oriented to person, place, and time.  Skin: Skin is warm and dry.  Psychiatric: He has a normal mood and affect. His behavior is normal.        Assessment & Plan:  DM- Stable but still not at maximal therapy. Hemoglobin A1c of 7.1 today. We discussed increasing the metformin 2000 mg twice a day. He's currently taking thousand and the morning and 500 in the evening. He also has noted his Vania Rea was extremely expensive. A 90 day supply was only covered by 50% and so he had to pay $612. He would like to find replacement. He will not need a replacement of June. Wilder Glade is not covered on his planned and he was intolerant to Invokana so we may need to consider switching to Victoza.  HTN - well controlled. In fact a little low.  Willmonitor. Usually BP is optimal.  LAbs up to date.

## 2016-04-15 ENCOUNTER — Encounter: Payer: Self-pay | Admitting: Family Medicine

## 2016-04-16 ENCOUNTER — Other Ambulatory Visit: Payer: Self-pay | Admitting: *Deleted

## 2016-04-16 MED ORDER — LORAZEPAM 1 MG PO TABS: 1.0000 mg | ORAL_TABLET | Freq: Two times a day (BID) | ORAL | Status: DC | PRN

## 2016-04-16 NOTE — Telephone Encounter (Signed)
Spoke w/Joel.Audelia Hives [place]

## 2016-04-28 ENCOUNTER — Encounter: Payer: Self-pay | Admitting: Family Medicine

## 2016-04-29 ENCOUNTER — Other Ambulatory Visit: Payer: Self-pay | Admitting: Physician Assistant

## 2016-04-29 ENCOUNTER — Encounter: Payer: Self-pay | Admitting: Family Medicine

## 2016-04-29 MED ORDER — LIRAGLUTIDE 18 MG/3ML ~~LOC~~ SOPN: PEN_INJECTOR | SUBCUTANEOUS | Status: DC

## 2016-05-21 ENCOUNTER — Ambulatory Visit (INDEPENDENT_AMBULATORY_CARE_PROVIDER_SITE_OTHER): Payer: Medicare Other | Admitting: Family Medicine

## 2016-05-21 ENCOUNTER — Encounter: Payer: Self-pay | Admitting: Family Medicine

## 2016-05-21 VITALS — BP 113/63 | HR 95 | Wt 301.0 lb

## 2016-05-21 DIAGNOSIS — I10 Essential (primary) hypertension: Secondary | ICD-10-CM

## 2016-05-21 DIAGNOSIS — E1149 Type 2 diabetes mellitus with other diabetic neurological complication: Secondary | ICD-10-CM | POA: Diagnosis not present

## 2016-05-21 LAB — POCT GLYCOSYLATED HEMOGLOBIN (HGB A1C): Hemoglobin A1C: 6.7

## 2016-05-21 NOTE — Progress Notes (Signed)
Subjective:    CC:  DM  HPI:  Diabetes - no hypoglycemic events. No wounds or sores that are not healing well. No increased thirst or urination. Checking glucose at home. Taking medications as prescribed without any side effects.He has really enjoyed being off of the [place]. He says he is not urinating nearly as much and that has actually improved his quality of life. He has not had any nausea or side effects on the Victoza and is up to 1.2 mg. He's been on it for 2 weeks now. Lab Results  Component Value Date   HGBA1C 7.1 02/20/2016   Hypertension- Pt denies chest pain, SOB, dizziness, or heart palpitations.  Taking meds as directed w/o problems.  Denies medication side effects.      Past medical history, Surgical history, Family history not pertinant except as noted below, Social history, Allergies, and medications have been entered into the medical record, reviewed, and corrections made.   Review of Systems: No fevers, chills, night sweats, weight loss, chest pain, or shortness of breath.   Objective:    General: Well Developed, well nourished, and in no acute distress.  Neuro: Alert and oriented x3, extra-ocular muscles intact, sensation grossly intact.  HEENT: Normocephalic, atraumatic  Skin: Warm and dry, no rashes. Cardiac: Regular rate and rhythm, no murmurs rubs or gallops, no lower extremity edema.  Respiratory: Clear to auscultation bilaterally. Not using accessory muscles, speaking in full sentences.   Impression and Recommendations:   DM-Well. Hemoglobin A1c actually looks better today at 6.7 but he has been using his insulin a little bit more frequently. Continue with current regimen. After couple of weeks if he still having to get extra injections and go ahead and increase the Victoza to 1.8 mg.  HTN -we ll controlled. Continue current regimen.

## 2016-05-28 ENCOUNTER — Encounter: Payer: Self-pay | Admitting: Family Medicine

## 2016-07-04 ENCOUNTER — Other Ambulatory Visit: Payer: Self-pay | Admitting: Physician Assistant

## 2016-08-05 ENCOUNTER — Other Ambulatory Visit: Payer: Self-pay | Admitting: Family Medicine

## 2016-08-05 ENCOUNTER — Encounter: Payer: Self-pay | Admitting: Family Medicine

## 2016-08-05 MED ORDER — LIRAGLUTIDE 18 MG/3ML ~~LOC~~ SOPN: PEN_INJECTOR | SUBCUTANEOUS | 1 refills | Status: DC

## 2016-08-25 ENCOUNTER — Encounter: Payer: Self-pay | Admitting: Family Medicine

## 2016-08-25 ENCOUNTER — Ambulatory Visit (INDEPENDENT_AMBULATORY_CARE_PROVIDER_SITE_OTHER): Payer: Medicare Other | Admitting: Family Medicine

## 2016-08-25 VITALS — BP 118/66 | HR 96 | Ht 74.0 in | Wt 301.0 lb

## 2016-08-25 DIAGNOSIS — E1149 Type 2 diabetes mellitus with other diabetic neurological complication: Secondary | ICD-10-CM

## 2016-08-25 DIAGNOSIS — I2510 Atherosclerotic heart disease of native coronary artery without angina pectoris: Secondary | ICD-10-CM | POA: Diagnosis not present

## 2016-08-25 DIAGNOSIS — Z23 Encounter for immunization: Secondary | ICD-10-CM | POA: Diagnosis not present

## 2016-08-25 DIAGNOSIS — E1121 Type 2 diabetes mellitus with diabetic nephropathy: Secondary | ICD-10-CM | POA: Diagnosis not present

## 2016-08-25 DIAGNOSIS — N183 Chronic kidney disease, stage 3 (moderate): Secondary | ICD-10-CM

## 2016-08-25 DIAGNOSIS — N1832 Chronic kidney disease, stage 3b: Secondary | ICD-10-CM

## 2016-08-25 LAB — POCT UA - MICROALBUMIN
Creatinine, POC: 300 mg/dL
MICROALBUMIN (UR) POC: 80 mg/L

## 2016-08-25 LAB — POCT GLYCOSYLATED HEMOGLOBIN (HGB A1C): HEMOGLOBIN A1C: 6.7

## 2016-08-25 NOTE — Addendum Note (Signed)
Addended by: Beatris Ship L on: 08/25/2016 08:44 AM   Modules accepted: Orders

## 2016-08-25 NOTE — Progress Notes (Signed)
Subjective:    CC:  DM  HPI: Diabetes - no hypoglycemic events. No wounds or sores that are not healing well. No increased thirst or urination. Checking glucose at home. 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  Hypertension- Pt denies chest pain, SOB, dizziness, or heart palpitations.  Taking meds as directed w/o problems.  Denies medication side effects.    CAD- Chest pain shortest of breath. He takes an aspirin daily. He is also on a statin.  Past medical history, Surgical history, Family history not pertinant except as noted below, Social history, Allergies, and medications have been entered into the medical record, reviewed, and corrections made.   Review of Systems: No fevers, chills, night sweats, weight loss, chest pain, or shortness of breath.   Objective:    General: Well Developed, well nourished, and in no acute distress.  Neuro: Alert and oriented x3, extra-ocular muscles intact, sensation grossly intact.  HEENT: Normocephalic, atraumatic  Skin: Warm and dry, no rashes. Cardiac: Regular rate and rhythm, no murmurs rubs or gallops, no lower extremity edema.  Respiratory: Clear to auscultation bilaterally. Not using accessory muscles, speaking in full sentences.   Impression and Recommendations:   DM- Well controlled. Continue current regimen. Follow up in 4 mo. On Statin, ASA and ACE.    HTN - Well controlled. Continue current regimen. Follow up in  6 mol    CAD- Asytmptomatic.   CKD 3b/A2 - Due for BMP. Pos urine micro. No change.    Reminded him he is due for repeat colonoscopy per his last report in 2014. He has not heard from Dr. Doristine Locks office yet. He says he will call them and make sure that he doesn't need to get scheduled.

## 2016-10-03 ENCOUNTER — Other Ambulatory Visit: Payer: Self-pay | Admitting: Family Medicine

## 2016-10-25 LAB — COMPLETE METABOLIC PANEL WITH GFR
ALK PHOS: 46 U/L (ref 40–115)
ALT: 23 U/L (ref 9–46)
AST: 25 U/L (ref 10–35)
Albumin: 4.2 g/dL (ref 3.6–5.1)
BUN: 19 mg/dL (ref 7–25)
CALCIUM: 9.9 mg/dL (ref 8.6–10.3)
CHLORIDE: 103 mmol/L (ref 98–110)
CO2: 25 mmol/L (ref 20–31)
Creat: 1.31 mg/dL — ABNORMAL HIGH (ref 0.70–1.18)
GFR, Est African American: 63 mL/min (ref >=60)
GFR, Est Non African American: 54 mL/min — ABNORMAL LOW (ref >=60)
GLUCOSE: 156 mg/dL — AB (ref 65–99)
POTASSIUM: 5 mmol/L (ref 3.5–5.3)
SODIUM: 138 mmol/L (ref 135–146)
Total Bilirubin: 0.5 mg/dL (ref 0.2–1.2)
Total Protein: 7.4 g/dL (ref 6.1–8.1)

## 2016-10-25 LAB — LIPID PANEL
CHOL/HDL RATIO: 3.8 ratio (ref <5.0)
CHOLESTEROL: 149 mg/dL (ref <200)
HDL: 39 mg/dL — AB (ref >40)
LDL Cholesterol: 74 mg/dL (ref <100)
Triglycerides: 180 mg/dL — ABNORMAL HIGH (ref <150)
VLDL: 36 mg/dL — ABNORMAL HIGH (ref <30)

## 2016-11-11 ENCOUNTER — Other Ambulatory Visit: Payer: Self-pay | Admitting: Family Medicine

## 2016-12-24 ENCOUNTER — Encounter: Payer: Self-pay | Admitting: Family Medicine

## 2016-12-24 ENCOUNTER — Ambulatory Visit (INDEPENDENT_AMBULATORY_CARE_PROVIDER_SITE_OTHER): Payer: Medicare Other | Admitting: Family Medicine

## 2016-12-24 VITALS — BP 107/64 | HR 77 | Ht 74.0 in | Wt 299.0 lb

## 2016-12-24 DIAGNOSIS — I10 Essential (primary) hypertension: Secondary | ICD-10-CM

## 2016-12-24 DIAGNOSIS — R21 Rash and other nonspecific skin eruption: Secondary | ICD-10-CM | POA: Diagnosis not present

## 2016-12-24 DIAGNOSIS — E1149 Type 2 diabetes mellitus with other diabetic neurological complication: Secondary | ICD-10-CM | POA: Diagnosis not present

## 2016-12-24 LAB — POCT GLYCOSYLATED HEMOGLOBIN (HGB A1C): Hemoglobin A1C: 7.3

## 2016-12-24 MED ORDER — TRIAMCINOLONE ACETONIDE 0.5 % EX OINT: 1.0000 "application " | TOPICAL_OINTMENT | Freq: Every day | CUTANEOUS | 0 refills | Status: DC | PRN

## 2016-12-24 NOTE — Progress Notes (Signed)
Subjective:    CC: DM  HPI:  Diabetes - no hypoglycemic events. No wounds or sores that are not healing well. No increased thirst or urination. Checking glucose at home. He says he developed a rash on his forearms after going up on his Victoza to 1.8 mg. He says it really isn't bothersome it's not itchy or painful. He has just get the circular lesions that are dry scale and will eventually heal. He's had them for almost 2 months. He said he went online and saw that it could cause a rash so he went back down on his dose to 1.2 mg and feels like the rash is actually starting to heal. His wife has been putting a cream on it. He's not sure what kind but thinks it's probably just a moisturizer.  Hypertension- Pt denies chest pain, SOB, dizziness, or heart palpitations.  Taking meds as directed w/o problems.  Denies medication side effects.     Past medical history, Surgical history, Family history not pertinant except as noted below, Social history, Allergies, and medications have been entered into the medical record, reviewed, and corrections made.   Review of Systems: No fevers, chills, night sweats, weight loss, chest pain, or shortness of breath.   Objective:    General: Well Developed, well nourished, and in no acute distress.  Neuro: Alert and oriented x3, extra-ocular muscles intact, sensation grossly intact.  HEENT: Normocephalic, atraumatic  Skin: Warm and dry, He does have some erythematous dry scaling round and oval lesions each approximately half a centimeters scattered over both forearms. Some early healing phase with this just a little bit of hyperpigmentation. None are open wounds or draining.. Cardiac: Regular rate and rhythm, no murmurs rubs or gallops, no lower extremity edema.  Respiratory: Clear to auscultation bilaterally. Not using accessory muscles, speaking in full sentences.   Impression and Recommendations:    Diabetes-uncontrolled. Hemoglobin A1c up to 7.3 today from  previous of 6.7. We discussed getting back on track with healthy diet and exercise. He admits he hasn't been as active this winter. Normally he is more consistent with coughing and going to the gym and trying to walk. I think we can easily get back to an A1c under 7 over the next 3 months. Foot exam performed today. Next  Rash-unclear if truly from the medication or not. It's on that it's just on his forearms. The appearance itself is almost like eczema but is really not getting any itching which is a little unusual. Will treat with a topical triamcinolone and encouraged him to continue to use a very good moisturizer. If it doesn't completely clear then let me know. Or if it suddenly gets worse we can always stop the Victoza and see if that could be the culprit.  HTN - Well controlled. Continue current regimen. Follow up in  3-4 months.

## 2017-01-30 LAB — HM DIABETES EYE EXAM

## 2017-03-18 ENCOUNTER — Other Ambulatory Visit: Payer: Self-pay | Admitting: Family Medicine

## 2017-03-25 ENCOUNTER — Ambulatory Visit: Payer: Medicare Other | Admitting: Family Medicine

## 2017-04-21 LAB — BASIC METABOLIC PANEL
Albumin: 4
CALCIUM: 9.5
CREATININE: 2 — AB (ref 0.6–1.3)
Chloride: 103
Glucose: 164
POTASSIUM: 4.8 (ref 3.4–5.3)
Sodium: 138 (ref 137–147)
Urea Nitrogen: 25

## 2017-04-21 LAB — HEMOGLOBIN A1C: Hemoglobin A1C: 6.9

## 2017-04-21 LAB — LIPID PANEL
Cholesterol: 159 (ref 0–200)
HDL: 43 (ref 35–70)
LDL Cholesterol: 44
Triglycerides: 358 — AB (ref 40–160)

## 2017-04-21 LAB — CBC AND DIFFERENTIAL
HEMOGLOBIN: 16 (ref 13.5–17.5)
Platelets: 226 (ref 150–399)
WBC: 7.3

## 2017-04-21 LAB — HEPATIC FUNCTION PANEL
ALT: 41 — AB (ref 10–40)
AST: 31 (ref 14–40)
Alkaline Phosphatase: 73 (ref 25–125)
Bilirubin, Total: 0.3

## 2017-04-21 LAB — TSH: TSH: 2.02 (ref 0.41–5.90)

## 2017-04-21 LAB — PSA: PSA: 4.59

## 2017-05-04 ENCOUNTER — Ambulatory Visit (INDEPENDENT_AMBULATORY_CARE_PROVIDER_SITE_OTHER): Payer: Medicare Other | Admitting: Family Medicine

## 2017-05-04 ENCOUNTER — Encounter: Payer: Self-pay | Admitting: Family Medicine

## 2017-05-04 ENCOUNTER — Telehealth: Payer: Self-pay | Admitting: Family Medicine

## 2017-05-04 VITALS — BP 138/76 | HR 88 | Ht 74.0 in | Wt 295.0 lb

## 2017-05-04 DIAGNOSIS — I10 Essential (primary) hypertension: Secondary | ICD-10-CM | POA: Diagnosis not present

## 2017-05-04 DIAGNOSIS — E1121 Type 2 diabetes mellitus with diabetic nephropathy: Secondary | ICD-10-CM

## 2017-05-04 DIAGNOSIS — E1149 Type 2 diabetes mellitus with other diabetic neurological complication: Secondary | ICD-10-CM | POA: Diagnosis not present

## 2017-05-04 LAB — POCT GLYCOSYLATED HEMOGLOBIN (HGB A1C): Hemoglobin A1C: 6.7

## 2017-05-04 MED ORDER — METFORMIN HCL 1000 MG PO TABS: 1000.0000 mg | ORAL_TABLET | Freq: Two times a day (BID) | ORAL | 0 refills | Status: DC

## 2017-05-04 NOTE — Telephone Encounter (Signed)
[facility], and [facility] exam.

## 2017-05-04 NOTE — Progress Notes (Signed)
Subjective:    CC: DM  HPI: Hypertension- Pt denies chest pain, SOB, dizziness, or heart palpitations.  Taking meds as directed w/o problems.  Denies medication side effects.    Diabetes - no hypoglycemic events. No wounds or sores that are not healing well. No increased thirst or urination. Checking glucose at home. Taking medications as prescribed without any side effects.  Diabetic nephropathy - says just had labs and urine done at the [place].     Past medical history, Surgical history, Family history not pertinant except as noted below, Social history, Allergies, and medications have been entered into the medical record, reviewed, and corrections made.   Review of Systems: No fevers, chills, night sweats, weight loss, chest pain, or shortness of breath.   Objective:    General: Well Developed, well nourished, and in no acute distress.  Neuro: Alert and oriented x3, extra-ocular muscles intact, sensation grossly intact.  HEENT: Normocephalic, atraumatic  Skin: Warm and dry, no rashes. Cardiac: Regular rate and rhythm, no murmurs rubs or gallops, no lower extremity edema.  Respiratory: Clear to auscultation bilaterally. Not using accessory muscles, speaking in full sentences.   Impression and Recommendations:    DM- Well controlled. Continue current regimen. Follow up in  3-4 months. [facility].   Lab Results  Component Value Date   HGBA1C 6.7 05/04/2017   HTN- Well controlled. Continue current regimen. Follow up in  3-4 months.    Diabetic nephropathy - will call [facility] for recent [facility].

## 2017-05-05 NOTE — Telephone Encounter (Signed)
Fax request placed up front.

## 2017-06-17 ENCOUNTER — Other Ambulatory Visit: Payer: Self-pay | Admitting: Family Medicine

## 2017-07-15 ENCOUNTER — Encounter: Payer: Self-pay | Admitting: Family Medicine

## 2017-07-15 LAB — MAGNESIUM: MAGNESIUM: 2.1

## 2017-08-02 ENCOUNTER — Other Ambulatory Visit: Payer: Self-pay | Admitting: Family Medicine

## 2017-08-31 ENCOUNTER — Ambulatory Visit (INDEPENDENT_AMBULATORY_CARE_PROVIDER_SITE_OTHER): Payer: Medicare Other | Admitting: Family Medicine

## 2017-08-31 ENCOUNTER — Encounter: Payer: Self-pay | Admitting: Family Medicine

## 2017-08-31 VITALS — BP 118/59 | HR 95 | Ht 74.0 in | Wt 296.0 lb

## 2017-08-31 DIAGNOSIS — N183 Chronic kidney disease, stage 3 (moderate): Secondary | ICD-10-CM

## 2017-08-31 DIAGNOSIS — Z23 Encounter for immunization: Secondary | ICD-10-CM

## 2017-08-31 DIAGNOSIS — N1832 Chronic kidney disease, stage 3b: Secondary | ICD-10-CM

## 2017-08-31 DIAGNOSIS — I10 Essential (primary) hypertension: Secondary | ICD-10-CM | POA: Diagnosis not present

## 2017-08-31 DIAGNOSIS — E1149 Type 2 diabetes mellitus with other diabetic neurological complication: Secondary | ICD-10-CM | POA: Diagnosis not present

## 2017-08-31 DIAGNOSIS — K4020 Bilateral inguinal hernia, without obstruction or gangrene, not specified as recurrent: Secondary | ICD-10-CM

## 2017-08-31 LAB — POCT GLYCOSYLATED HEMOGLOBIN (HGB A1C): Hemoglobin A1C: 6.8

## 2017-08-31 NOTE — Progress Notes (Signed)
Subjective:    CC: DM, HTN, kidney  HPI: Diabetes - no hypoglycemic events. No wounds or sores that are not healing well. No increased thirst or urination. Checking glucose at home. Taking medications as prescribed without any side effects. [facility] bc of his renal function. Last creatinine was 2.0. Taking 30 units BID on Levemir.    Hypertension- Pt denies chest pain, SOB, dizziness, or heart palpitations.  Taking meds as directed w/o problems.  Denies medication side effects.    CKD - he is now off metformin Lab Results  Component Value Date   CREATININE 2.0 (A) 04/21/2017     Past medical history, Surgical history, Family history not pertinant except as noted below, Social history, Allergies, and medications have been entered into the medical record, reviewed, and corrections made.   Review of Systems: No fevers, chills, night sweats, weight loss, chest pain, or shortness of breath.   Objective:    General: Well Developed, well nourished, and in no acute distress.  Neuro: Alert and oriented x3, extra-ocular muscles intact, sensation grossly intact.  HEENT: Normocephalic, atraumatic  Skin: Warm and dry, no rashes. Cardiac: Regular rate and rhythm, no murmurs rubs or gallops, no lower extremity edema.  Respiratory: Clear to auscultation bilaterally. Not using accessory muscles, speaking in full sentences.   Impression and Recommendations:    DM- Well controlled. Continue current regimen. Follow up in  4 months A1C of 6.8.  Up to date on eye exam etc.    HTN- Well controlled. Continue current regimen. Follow up in  4 months.    CKD 3 - off metformin.  Med list updated.  Follow every 6 months.     Colon can screening.  Discussed timing of next scope. He says he is due in 5 years in stead of 3. Stop with [facility] about it recently.

## 2018-01-04 ENCOUNTER — Other Ambulatory Visit: Payer: Self-pay | Admitting: Family Medicine

## 2018-01-04 ENCOUNTER — Ambulatory Visit: Payer: Medicare Other | Admitting: Family Medicine

## 2018-01-07 ENCOUNTER — Other Ambulatory Visit: Payer: Self-pay

## 2018-01-07 MED ORDER — TRIAMCINOLONE ACETONIDE 0.5 % EX OINT: 1.0000 "application " | TOPICAL_OINTMENT | Freq: Every day | CUTANEOUS | 0 refills | Status: DC | PRN

## 2018-01-14 ENCOUNTER — Other Ambulatory Visit: Payer: Self-pay | Admitting: Family Medicine

## 2018-01-22 ENCOUNTER — Encounter: Payer: Self-pay | Admitting: Family Medicine

## 2018-01-22 ENCOUNTER — Ambulatory Visit: Payer: Medicare Other | Admitting: Family Medicine

## 2018-01-22 VITALS — BP 96/57 | HR 93 | Ht 74.0 in | Wt 296.0 lb

## 2018-01-22 DIAGNOSIS — E1149 Type 2 diabetes mellitus with other diabetic neurological complication: Secondary | ICD-10-CM | POA: Diagnosis not present

## 2018-01-22 DIAGNOSIS — N183 Chronic kidney disease, stage 3 (moderate): Secondary | ICD-10-CM | POA: Diagnosis not present

## 2018-01-22 DIAGNOSIS — I10 Essential (primary) hypertension: Secondary | ICD-10-CM | POA: Diagnosis not present

## 2018-01-22 DIAGNOSIS — N1832 Chronic kidney disease, stage 3b: Secondary | ICD-10-CM

## 2018-01-22 NOTE — Progress Notes (Signed)
Subjective:    CC: BP, glucose  HPI:  Hypertension- Pt denies chest pain, SOB, dizziness, or heart palpitations.  Taking meds as directed w/o problems.  Denies medication side effects.    Diabetes - no hypoglycemic events. No wounds or sores that are not healing well. No increased thirst or urination. Checking glucose at home. Taking medications as prescribed without any side effects.  F/U CKD - no changes.  He does urinate frequently but is because he drinks over a gallon of water a day.   Past medical history, Surgical history, Family history not pertinant except as noted below, Social history, Allergies, and medications have been entered into the medical record, reviewed, and corrections made.   Review of Systems: No fevers, chills, night sweats, weight loss, chest pain, or shortness of breath.   Objective:    General: Well Developed, well nourished, and in no acute distress.  Neuro: Alert and oriented x3, extra-ocular muscles intact, sensation grossly intact.  HEENT: Normocephalic, atraumatic  Skin: Warm and dry, no rashes. Cardiac: Regular rate and rhythm, no murmurs rubs or gallops, no lower extremity edema.  Respiratory: Clear to auscultation bilaterally. Not using accessory muscles, speaking in full sentences.   Impression and Recommendations:    HTN - BP borderline low. Cut bp pill in half.  Monitor blood pressure at home.  If it looks good that he can let me know and we can always drop a tab down to 10 mg daily.  DM - foot exam performed today.  We will will check A1c today.  He suspects it may be just slightly elevated because he has not been doing his regular walking through the winter months.  CKD3 - due to recheck renla function

## 2018-01-23 LAB — HEMOGLOBIN A1C
EAG (MMOL/L): 9.3 (calc)
HEMOGLOBIN A1C: 7.5 %{Hb} — AB (ref <5.7)
Mean Plasma Glucose: 169 (calc)

## 2018-01-23 LAB — BASIC METABOLIC PANEL WITH GFR
BUN/Creatinine Ratio: 15 (calc) (ref 6–22)
BUN: 21 mg/dL (ref 7–25)
CHLORIDE: 104 mmol/L (ref 98–110)
CO2: 28 mmol/L (ref 20–32)
Calcium: 9.9 mg/dL (ref 8.6–10.3)
Creat: 1.41 mg/dL — ABNORMAL HIGH (ref 0.70–1.18)
GFR, EST AFRICAN AMERICAN: 57 mL/min/{1.73_m2} — AB (ref >=60)
GFR, Est Non African American: 49 mL/min/{1.73_m2} — ABNORMAL LOW (ref >=60)
Glucose, Bld: 109 mg/dL — ABNORMAL HIGH (ref 65–99)
Potassium: 4.7 mmol/L (ref 3.5–5.3)
Sodium: 139 mmol/L (ref 135–146)

## 2018-02-08 ENCOUNTER — Other Ambulatory Visit: Payer: Self-pay | Admitting: Family Medicine

## 2018-02-08 DIAGNOSIS — C189 Malignant neoplasm of colon, unspecified: Secondary | ICD-10-CM

## 2018-02-10 ENCOUNTER — Other Ambulatory Visit: Payer: Self-pay | Admitting: Family Medicine

## 2018-02-10 DIAGNOSIS — C189 Malignant neoplasm of colon, unspecified: Secondary | ICD-10-CM

## 2018-02-11 ENCOUNTER — Encounter: Payer: Self-pay | Admitting: Family Medicine

## 2018-02-11 MED ORDER — PRAVASTATIN SODIUM 40 MG PO TABS: 40.0000 mg | ORAL_TABLET | Freq: Every day | ORAL | 3 refills | Status: DC

## 2018-04-01 ENCOUNTER — Other Ambulatory Visit: Payer: Self-pay | Admitting: Family Medicine

## 2018-05-24 ENCOUNTER — Encounter: Payer: Self-pay | Admitting: Family Medicine

## 2018-05-24 ENCOUNTER — Ambulatory Visit: Payer: Medicare Other | Admitting: Family Medicine

## 2018-05-24 VITALS — BP 109/53 | HR 95 | Ht 74.0 in | Wt 297.0 lb

## 2018-05-24 DIAGNOSIS — E1121 Type 2 diabetes mellitus with diabetic nephropathy: Secondary | ICD-10-CM

## 2018-05-24 DIAGNOSIS — E1149 Type 2 diabetes mellitus with other diabetic neurological complication: Secondary | ICD-10-CM

## 2018-05-24 DIAGNOSIS — I10 Essential (primary) hypertension: Secondary | ICD-10-CM

## 2018-05-24 DIAGNOSIS — M170 Bilateral primary osteoarthritis of knee: Secondary | ICD-10-CM

## 2018-05-24 DIAGNOSIS — Z125 Encounter for screening for malignant neoplasm of prostate: Secondary | ICD-10-CM

## 2018-05-24 LAB — POCT GLYCOSYLATED HEMOGLOBIN (HGB A1C): Hemoglobin A1C: 7.1 % — AB (ref 4.0–5.6)

## 2018-05-24 MED ORDER — DICLOFENAC SODIUM 1 % TD GEL: TRANSDERMAL | 3 refills | Status: DC

## 2018-05-24 NOTE — Progress Notes (Signed)
Subjective:    CC:   HPI:  Diabetes - Rareley a  hypoglycemic event. No wounds or sores that are not healing well. No increased thirst or urination. Checking glucose at home. Taking medications as prescribed without any side effects.  Eye exam performed in May at the [place]. he reports his postprandials are typically right around 200.  Has noticed over the last 6 weeks glucose has been running just a little bit higher.  Hypertension- Pt denies chest pain, SOB, dizziness, or heart palpitations.  Taking meds as directed w/o problems.  Denies medication side effects.    CKD 3/diabetic nephropathy with proteinuria-continuing to follow kidney function every 6 months.  He is diabetic but has been stable.  He is on an ACE inhibitor.  Past medical history, Surgical history, Family history not pertinant except as noted below, Social history, Allergies, and medications have been entered into the medical record, reviewed, and corrections made.   Review of Systems: No fevers, chills, night sweats, weight loss, chest pain, or shortness of breath.   Objective:    General: Well Developed, well nourished, and in no acute distress.  Neuro: Alert and oriented x3, extra-ocular muscles intact, sensation grossly intact.  HEENT: Normocephalic, atraumatic  Skin: Warm and dry, no rashes. Cardiac: Regular rate and rhythm, no murmurs rubs or gallops, no lower extremity edema.  Respiratory: Clear to auscultation bilaterally. Not using accessory muscles, speaking in full sentences.   Impression and Recommendations:   DM -improved.  Hemoglobin A1c down to 7.1 which I really think is due to his getting back on track with his regular exercise and walking daily.  We will continue with current regimen and follow back up in 4 months.  HTN - Well controlled. Continue current regimen. Follow up in  4-6 months. Ok to dec lisinopril to 5mg  if would like.    CKD 3/diabetic nephropathy with proteinuria -recheck renal function.   Continue with ACE inhibitor.

## 2018-05-26 ENCOUNTER — Telehealth: Payer: Self-pay

## 2018-05-26 NOTE — Telephone Encounter (Signed)
[employer] called and states Brian Strickland has been non compliant with Victoza. Cost may be a issue due to non-compliance. Christina with [employer] suggests he may qualify for Patient Assistance with [employer]. [phone].   I called the patient and he states she has been taking the Victoza as prescribed and reports no problem getting the medications.

## 2018-06-24 ENCOUNTER — Other Ambulatory Visit: Payer: Self-pay | Admitting: Family Medicine

## 2018-07-01 LAB — PSA: PSA: 3.5 ng/mL (ref <=4.0)

## 2018-07-01 LAB — COMPLETE METABOLIC PANEL WITH GFR
AG RATIO: 1.4 (calc) (ref 1.0–2.5)
ALBUMIN MSPROF: 4.4 g/dL (ref 3.6–5.1)
ALKALINE PHOSPHATASE (APISO): 56 U/L (ref 40–115)
ALT: 26 U/L (ref 9–46)
AST: 25 U/L (ref 10–35)
BILIRUBIN TOTAL: 0.5 mg/dL (ref 0.2–1.2)
BUN / CREAT RATIO: 14 (calc) (ref 6–22)
BUN: 21 mg/dL (ref 7–25)
CHLORIDE: 102 mmol/L (ref 98–110)
CO2: 27 mmol/L (ref 20–32)
Calcium: 10 mg/dL (ref 8.6–10.3)
Creat: 1.45 mg/dL — ABNORMAL HIGH (ref 0.70–1.18)
GFR, EST AFRICAN AMERICAN: 55 mL/min/{1.73_m2} — AB (ref >=60)
GFR, Est Non African American: 48 mL/min/{1.73_m2} — ABNORMAL LOW (ref >=60)
GLUCOSE: 194 mg/dL — AB (ref 65–99)
Globulin: 3.2 g/dL (calc) (ref 1.9–3.7)
POTASSIUM: 5.3 mmol/L (ref 3.5–5.3)
Sodium: 138 mmol/L (ref 135–146)
Total Protein: 7.6 g/dL (ref 6.1–8.1)

## 2018-07-01 LAB — LIPID PANEL
CHOLESTEROL: 183 mg/dL (ref <200)
HDL: 44 mg/dL (ref >40)
LDL CHOLESTEROL (CALC): 117 mg/dL — AB
Non-HDL Cholesterol (Calc): 139 mg/dL (calc) — ABNORMAL HIGH (ref <130)
Total CHOL/HDL Ratio: 4.2 (calc) (ref <5.0)
Triglycerides: 110 mg/dL (ref <150)

## 2018-09-17 ENCOUNTER — Other Ambulatory Visit: Payer: Self-pay | Admitting: Family Medicine

## 2018-09-27 ENCOUNTER — Encounter: Payer: Self-pay | Admitting: Family Medicine

## 2018-09-27 ENCOUNTER — Ambulatory Visit: Payer: Medicare Other | Admitting: Family Medicine

## 2018-09-27 VITALS — BP 94/61 | HR 91 | Ht 72.0 in | Wt 298.0 lb

## 2018-09-27 DIAGNOSIS — M170 Bilateral primary osteoarthritis of knee: Secondary | ICD-10-CM | POA: Diagnosis not present

## 2018-09-27 DIAGNOSIS — I2510 Atherosclerotic heart disease of native coronary artery without angina pectoris: Secondary | ICD-10-CM

## 2018-09-27 DIAGNOSIS — E1149 Type 2 diabetes mellitus with other diabetic neurological complication: Secondary | ICD-10-CM | POA: Diagnosis not present

## 2018-09-27 DIAGNOSIS — I10 Essential (primary) hypertension: Secondary | ICD-10-CM | POA: Diagnosis not present

## 2018-09-27 DIAGNOSIS — R21 Rash and other nonspecific skin eruption: Secondary | ICD-10-CM

## 2018-09-27 LAB — POCT GLYCOSYLATED HEMOGLOBIN (HGB A1C): Hemoglobin A1C: 7.3 % — AB (ref 4.0–5.6)

## 2018-09-27 MED ORDER — DICLOFENAC SODIUM 1 % TD GEL: TRANSDERMAL | 3 refills | Status: AC

## 2018-09-27 MED ORDER — CLOBETASOL PROPIONATE 0.05 % EX OINT: 1.0000 "application " | TOPICAL_OINTMENT | Freq: Two times a day (BID) | CUTANEOUS | 0 refills | Status: DC

## 2018-09-27 NOTE — Patient Instructions (Addendum)
Increase Levemir to 50 units twice a day when you run out of the Victoza.   Call about 1-2 weeks after you increase the med and we can adjust the dose if needed.  Decrease you lisinopril to 10mg  a day

## 2018-09-27 NOTE — Progress Notes (Signed)
Subjective:    CC:   HPI:  Diabetes - no hypoglycemic events. No wounds or sores that are not healing well. No increased thirst or urination. Checking glucose at home. Taking medications as prescribed without any side effects. pt takes nonolog on SSI w/lunch, [facility]  Hypertension- Pt denies chest pain, SOB, dizziness, or heart palpitations.  Taking meds as directed w/o problems.  Denies medication side effects.    F/U CAD - No CP or SOB.    Has a rash on bilateral forearms. , Right hand.  Hx of agent orange exposure.   He has a lesion on his right lower leg is been there for 6 months.  He did try the triamcinolone cream but this really did not seem to help very much.  The rash is not very bothersome or itchy.  Past medical history, Surgical history, Family history not pertinant except as noted below, Social history, Allergies, and medications have been entered into the medical record, reviewed, and corrections made.   Review of Systems: No fevers, chills, night sweats, weight loss, chest pain, or shortness of breath.   Objective:    General: Well Developed, well nourished, and in no acute distress.  Neuro: Alert and oriented x3, extra-ocular muscles intact, sensation grossly intact.  HEENT: Normocephalic, atraumatic  Skin: Warm and dry.  His forearms he has more papular dried crusted lesions.  But on his right lower leg he has a larger patch he says is been there for at least 6 months.  Its been gradually getting a little bit larger.  Is a little erythematous patch on the left facial cheek.  He says they seem to come and go.  They really do not bother him very much.  They are not super itchy Cardiac: Regular rate and rhythm, no murmurs rubs or gallops, no lower extremity edema.  Respiratory: Clear to auscultation bilaterally. Not using accessory muscles, speaking in full sentences.       Impression and Recommendations:    DM - Stable. A1C of 7.3.  Says sugars  are up and down.  Is currently using Levemir 40 units twice a day and is using his NovoLog with breakfast and his evening meal.  He says he really does not eat much for lunch.  HTN - Well controlled. Continue current regimen. Follow up in  3 months.    CAD - Stable.    Rash-it always looks consistent with psoriasis particularly the lesions on the legs.  Will change to clobetasol cream.  Refer to dermatology.

## 2018-12-28 ENCOUNTER — Ambulatory Visit: Payer: Medicare Other | Admitting: Family Medicine

## 2018-12-28 ENCOUNTER — Encounter: Payer: Self-pay | Admitting: Family Medicine

## 2018-12-28 VITALS — BP 107/55 | HR 88 | Ht 72.0 in | Wt 297.0 lb

## 2018-12-28 DIAGNOSIS — E1149 Type 2 diabetes mellitus with other diabetic neurological complication: Secondary | ICD-10-CM | POA: Diagnosis not present

## 2018-12-28 DIAGNOSIS — N183 Chronic kidney disease, stage 3 (moderate): Secondary | ICD-10-CM | POA: Diagnosis not present

## 2018-12-28 DIAGNOSIS — I10 Essential (primary) hypertension: Secondary | ICD-10-CM | POA: Diagnosis not present

## 2018-12-28 DIAGNOSIS — N1832 Chronic kidney disease, stage 3b: Secondary | ICD-10-CM

## 2018-12-28 LAB — POCT GLYCOSYLATED HEMOGLOBIN (HGB A1C): Hemoglobin A1C: 7.8 % — AB (ref 4.0–5.6)

## 2018-12-28 LAB — BASIC METABOLIC PANEL WITH GFR
BUN / CREAT RATIO: 14 (calc) (ref 6–22)
BUN: 20 mg/dL (ref 7–25)
CHLORIDE: 102 mmol/L (ref 98–110)
CO2: 30 mmol/L (ref 20–32)
CREATININE: 1.4 mg/dL — AB (ref 0.70–1.18)
Calcium: 9.9 mg/dL (ref 8.6–10.3)
GFR, Est African American: 57 mL/min/{1.73_m2} — ABNORMAL LOW (ref >=60)
GFR, Est Non African American: 49 mL/min/{1.73_m2} — ABNORMAL LOW (ref >=60)
Glucose, Bld: 150 mg/dL — ABNORMAL HIGH (ref 65–99)
Potassium: 4.5 mmol/L (ref 3.5–5.3)
Sodium: 139 mmol/L (ref 135–146)

## 2018-12-28 MED ORDER — INSULIN ASPART 100 UNIT/ML ~~LOC~~ SOLN: SUBCUTANEOUS | 5 refills | Status: AC

## 2018-12-28 MED ORDER — INSULIN DETEMIR 100 UNIT/ML ~~LOC~~ SOLN: 50.0000 [IU] | Freq: Two times a day (BID) | SUBCUTANEOUS | 5 refills | Status: DC

## 2018-12-28 NOTE — Progress Notes (Signed)
Subjective:    CC: HTN, DM  HPI:  Hypertension- Pt denies chest pain, SOB, dizziness, or heart palpitations.  Taking meds as directed w/o problems.  Denies medication side effects.    Diabetes - no hypoglycemic events. No wounds or sores that are not healing well. No increased thirst or urination. Checking glucose at home. Taking medications as prescribed without any side effects. Reports pre dinner sugars aroudn 140-150. Says is sugar less than 110 eh feels shakey.   On Levemir 46 units BID.  He is off the Victoza.  Would like rx printed.    F/U CKD  - no recent changes.    Past medical history, Surgical history, Family history not pertinant except as noted below, Social history, Allergies, and medications have been entered into the medical record, reviewed, and corrections made.   Review of Systems: No fevers, chills, night sweats, weight loss, chest pain, or shortness of breath.   Objective:    General: Well Developed, well nourished, and in no acute distress.  Neuro: Alert and oriented x3, extra-ocular muscles intact, sensation grossly intact.  HEENT: Normocephalic, atraumatic  Skin: Warm and dry, no rashes. Cardiac: Regular rate and rhythm, no murmurs rubs or gallops, no lower extremity edema.  Respiratory: Clear to auscultation bilaterally. Not using accessory muscles, speaking in full sentences.   Impression and Recommendations:    HTN - Well controlled, though BP borderline low but he says his left arm always runs lower.   Continue current regimen. Follow up in  6 months.    DM - Uncontrolled. A1C up to 7.8 today.  Increase levemir to 50 units. Continue SSI. New rx given. Continue to work on healthy low carb diet and exercise.      CKD - due to recheck renal function.

## 2018-12-28 NOTE — Patient Instructions (Signed)
Please bring glucose log next time.

## 2018-12-29 LAB — BASIC METABOLIC PANEL
CREATININE: 1.4 — AB (ref 0.6–1.3)
Potassium: 4.5 (ref 3.4–5.3)
SODIUM: 138 (ref 137–147)

## 2018-12-29 LAB — LIPID PANEL
Cholesterol: 179 (ref 0–200)
HDL: 43 (ref 35–70)
LDL Cholesterol: 87
Triglycerides: 244 — AB (ref 40–160)

## 2018-12-29 LAB — HEPATIC FUNCTION PANEL
ALT: 38 (ref 10–40)
AST: 22 (ref 14–40)

## 2018-12-29 LAB — TSH: TSH: 3.77 (ref 0.41–5.90)

## 2018-12-29 LAB — PSA: PSA: 4

## 2018-12-29 LAB — CBC AND DIFFERENTIAL
Hemoglobin: 16 (ref 13.5–17.5)
Platelets: 213 (ref 150–399)
WBC: 7

## 2018-12-29 LAB — MICROALBUMIN, URINE: Microalb, Ur: 11.6

## 2018-12-29 LAB — VITAMIN D 25 HYDROXY (VIT D DEFICIENCY, FRACTURES): Vit D, 25-Hydroxy: 32.26

## 2018-12-29 LAB — HEMOGLOBIN A1C: Hemoglobin A1C: 7.6

## 2018-12-29 LAB — VITAMIN B12: Vitamin B-12: 195

## 2018-12-30 LAB — HM DIABETES EYE EXAM

## 2019-01-07 ENCOUNTER — Encounter: Payer: Self-pay | Admitting: Family Medicine

## 2019-01-07 MED ORDER — LIRAGLUTIDE 18 MG/3ML ~~LOC~~ SOPN: PEN_INJECTOR | SUBCUTANEOUS | 1 refills | Status: DC

## 2019-01-17 ENCOUNTER — Encounter: Payer: Self-pay | Admitting: Family Medicine

## 2019-03-28 ENCOUNTER — Ambulatory Visit (INDEPENDENT_AMBULATORY_CARE_PROVIDER_SITE_OTHER): Payer: PPO | Admitting: Family Medicine

## 2019-03-28 ENCOUNTER — Encounter: Payer: Self-pay | Admitting: Family Medicine

## 2019-03-28 VITALS — Temp 97.9°F | Ht 72.0 in

## 2019-03-28 DIAGNOSIS — I10 Essential (primary) hypertension: Secondary | ICD-10-CM

## 2019-03-28 DIAGNOSIS — E1121 Type 2 diabetes mellitus with diabetic nephropathy: Secondary | ICD-10-CM

## 2019-03-28 DIAGNOSIS — E1149 Type 2 diabetes mellitus with other diabetic neurological complication: Secondary | ICD-10-CM | POA: Diagnosis not present

## 2019-03-28 NOTE — Progress Notes (Signed)
Virtual Visit via Video Note  I connected with Brian Strickland on 03/28/19 at  9:50 AM EDT by a video enabled telemedicine application and verified that I am speaking with the correct person using two identifiers.   I discussed the limitations of evaluation and management by telemedicine and the availability of in person appointments. The patient expressed understanding and agreed to proceed.  Pt was at home and I was in my office for the virtual visit.      Subjective:    CC: Diabetes    HPI:  Diabetes - no hypoglycemic events. No wounds or sores that are not healing well. No increased thirst or urination. Checking glucose at home. Only over 200 about 2 times.  Taking medications as prescribed without any side effects.has cut out the eating out.   Follow-up diabetic neuropathy-not currently on a prescription medication.no recent changes.  saem numbness in his feet and hands.   Hypertension- Pt denies chest pain, SOB, dizziness, or heart palpitations.  Taking meds as directed w/o problems.  Denies medication side effects.  They have an old cuff and he doesn't trust it.      Past medical history, Surgical history, Family history not pertinant except as noted below, Social history, Allergies, and medications have been entered into the medical record, reviewed, and corrections made.   Review of Systems: No fevers, chills, night sweats, weight loss, chest pain, or shortness of breath.   Objective:    General: Speaking clearly in complete sentences without any shortness of breath.  Alert and oriented x3.  Normal judgment. No apparent acute distress. Well groomed.    Impression and Recommendations:   DM - he thinks his numbers improved from last time just by cutting out eating out at lunch daily which she had been doing.  Like to see him back in about 3 to 4 months hopefully we can get another A1c at that point and hopefully it is in the low 7 range.  Continue to try to work on  increasing activity level since he is not able to get out as much.Marland Kitchen   HTN - he is asymptomatic.  He is doing well.   Diabetic neuropathy-stable.  No recent changes or worsening of his symptoms.  Not currently on prescription therapy for neuropathy.   I discussed the assessment and treatment plan with the patient. The patient was provided an opportunity to ask questions and all were answered. The patient agreed with the plan and demonstrated an understanding of the instructions.   The patient was advised to call back or seek an in-person evaluation if the symptoms worsen or if the condition fails to improve as anticipated.   Beatrice Lecher, MD

## 2019-03-28 NOTE — Progress Notes (Signed)
Bs:118.Brian Strickland, [name]

## 2019-03-30 ENCOUNTER — Other Ambulatory Visit: Payer: Self-pay | Admitting: Family Medicine

## 2019-05-03 ENCOUNTER — Other Ambulatory Visit: Payer: Self-pay | Admitting: Family Medicine

## 2019-06-02 ENCOUNTER — Encounter: Payer: Self-pay | Admitting: Family Medicine

## 2019-06-08 ENCOUNTER — Other Ambulatory Visit: Payer: Self-pay

## 2019-07-07 ENCOUNTER — Encounter: Payer: Self-pay | Admitting: Family Medicine

## 2019-07-25 ENCOUNTER — Ambulatory Visit (INDEPENDENT_AMBULATORY_CARE_PROVIDER_SITE_OTHER): Payer: PPO | Admitting: Family Medicine

## 2019-07-25 DIAGNOSIS — Z23 Encounter for immunization: Secondary | ICD-10-CM

## 2019-11-12 ENCOUNTER — Other Ambulatory Visit: Payer: Self-pay | Admitting: Family Medicine

## 2019-11-14 NOTE — Telephone Encounter (Signed)
Must make follow up appointment

## 2019-11-15 ENCOUNTER — Encounter: Payer: Self-pay | Admitting: Family Medicine

## 2019-11-15 ENCOUNTER — Other Ambulatory Visit: Payer: Self-pay | Admitting: Family Medicine

## 2019-11-15 ENCOUNTER — Ambulatory Visit (INDEPENDENT_AMBULATORY_CARE_PROVIDER_SITE_OTHER): Payer: PPO | Admitting: Physician Assistant

## 2019-11-15 ENCOUNTER — Encounter: Payer: Self-pay | Admitting: Physician Assistant

## 2019-11-15 VITALS — Temp 96.0°F | Ht 74.0 in | Wt 280.0 lb

## 2019-11-15 DIAGNOSIS — E1149 Type 2 diabetes mellitus with other diabetic neurological complication: Secondary | ICD-10-CM | POA: Diagnosis not present

## 2019-11-15 MED ORDER — VICTOZA 18 MG/3ML ~~LOC~~ SOPN: 1.8000 mg | PEN_INJECTOR | Freq: Every day | SUBCUTANEOUS | 0 refills | Status: DC

## 2019-11-15 MED ORDER — VICTOZA 18 MG/3ML ~~LOC~~ SOPN: PEN_INJECTOR | SUBCUTANEOUS | 0 refills | Status: DC

## 2019-11-15 NOTE — Progress Notes (Signed)
Virtual Visit via Video (App used: TELEPHONE) Note  I connected with      Brian Strickland on 11/15/19 at 1:22 PM  by a telemedicine application and verified that I am speaking with the correct person using two identifiers.  Patient is at home in [place], [place] I am in office   I discussed the limitations of evaluation and management by telemedicine and the availability of in person appointments. The patient expressed understanding and agreed to proceed.  History of Present Illness: Brian Strickland is a 74 y.o. male who would like to discuss [place]    Reports he gets all of his medications through the [place] (Dr. Ronnald Ramp) except for Victoza and needs this renewed He reports he had an A1C at the [place] 3-4 months ago and it was 6.9 He is overdue for diabetic foot exam. Denies ulcers/wounds/pain or new or worsening paresthesias (he has stable neuropathy of feet/fingers).  FBG's are 130's-140's He attributes this to change in habits (less eating at restaurants) as well as monitoring his sugar more closely and adjusting his meal-time insulin  [facility] and [facility] He is scheduled to have [facility] tomorrow  Depression screen [facility] 2/9 11/15/2019 03/28/2019 09/27/2018 05/24/2018 08/31/2017  Decreased Interest 0 0 0 0 0  Down, Depressed, Hopeless 0 0 0 0 0  PHQ - 2 Score 0 0 0 0 0   Fall Risk  11/15/2019 06/08/2019 08/31/2017 02/20/2016 11/29/2014  Falls in the past year? 0 (No Data) No No No  Comment - Emmi Telephone Survey: data to providers prior to load - - -  Number falls in past yr: - (No Data) - - -  Comment - Emmi Telephone Survey Actual Response =  - - -     Observations/Objective: Temp (!) 96 F (35.6 C) (Oral)   Ht 6\' 2"  (1.88 m)   Wt 280 lb (127 kg)   BMI 35.95 kg/m  BP Readings from Last 3 Encounters:  12/28/18 (!) 107/55  09/27/18 94/61  05/24/18 (!) 109/53   Exam limited by telephone Neuro: alert and oriented x 3 Psych: cooperative,  euthymic mood, affect mood-congruent, speech is articulate, normal rate and volume; thought processes clear and goal-directed, normal judgment, good insight   Lab and Radiology Results No results found for this or any previous visit (from the past 72 hour(s)). No results found.     Assessment and Plan: 74 y.o. male with The encounter diagnosis was Type 2 diabetes mellitus with neurological complications ([place]).  A1C goal <7.0, cont current meds. Records will be requested from [facility] (Dr. Ronnald Ramp on 11/17/19 (record request given to Shaune Pascal) BP goal <1400/80, cont current meds LDL goal <70, cont statin Urine microalbumin: Diabetic foot exam: overdue, deferred due to COVID Diabetic eye exam: UTD 3 month f/u scheduled with Dr. Jerilynn Mages  PDMP not reviewed this encounter. No orders of the defined types were placed in this encounter.  Meds ordered this encounter  Medications  . liraglutide (VICTOZA) 18 MG/3ML SOPN    Sig: Inject 0.3 mLs (1.8 mg total) into the skin daily.    Dispense:  9 pen    Refill:  0    Order Specific Question:   Supervising Provider    Answer:   Emeterio Reeve [id number]   There are no Patient Instructions on file for this visit.  Instructions sent via MyChart. If MyChart not available, pt was given option for info via personal e-mail  w/ no guarantee of protected health info over unsecured e-mail communication, and MyChart sign-up instructions were sent to patient.   Follow Up Instructions: Return in about 3 months (around 02/13/2020).    I discussed the assessment and treatment plan with the patient. The patient was provided an opportunity to ask questions and all were answered. The patient agreed with the plan and demonstrated an understanding of the instructions.   The patient was advised to call back or seek an in-person evaluation if any new concerns, if symptoms worsen or if the condition fails to improve as anticipated.  10 minutes of  non-face-to-face time was provided during this encounter.      . . . . . . . . . . . . . Marland Kitchen                   Historical information moved to improve visibility of documentation.  Past Medical History:  Diagnosis Date  . CAD (coronary artery disease)   . colon ca dx'd [id number] 2006   colon/recurrent  . Diabetes mellitus    Type 2  . Hypertension   . 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 Urethral meatal stenosis    S/P dilatation (Dr. Geroge Baseman)   Past Surgical History:  Procedure Laterality Date  . cholectomy    . colon cancer  [id number]   recurrence  . CORONARY ARTERY BYPASS GRAFT    . REFRACTIVE SURGERY     Social History   Tobacco Use  . Smoking status: Never Smoker  . Smokeless tobacco: Never Used  Substance Use Topics  . Alcohol use: Yes    Comment: Socially   family history includes Cancer in his unknown relative.  Medications: Current Outpatient Medications  Medication Sig Dispense Refill  . AMBULATORY NON FORMULARY MEDICATION Glucometer of patient and insurance choice.  Diagnosis: Diabetes 1 each 0  . aspirin 81 MG tablet Take 81 mg by mouth daily.      . ciprofloxacin (CIPRO) 500 MG tablet TAKE 1 TABLET (500 MG TOTAL) BY MOUTH 2 (TWO) TIMES DAILY. 14 tablet 3  . clobetasol ointment (TEMOVATE) [id number] % Apply 1 application topically 2 (two) times daily. Give skin a break after 2 weeks. 60 g 0  . diclofenac sodium (VOLTAREN) 1 % GEL APPLY TOPICALLY 4 (FOUR) TIMES DAILY. 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 fexofenadine (ALLEGRA) 180 MG tablet Take 180 mg by mouth as needed.    . insulin aspart (NOVOLOG FLEXPEN) 100 UNIT/ML injection SSI TID with Meals. 30 mL 5  . liraglutide (VICTOZA) 18 MG/3ML SOPN Inject 0.3 mLs (1.8 mg total) into the skin daily. 9 pen 0  . lisinopril (PRINIVIL,ZESTRIL) 20 MG tablet Take 20 mg by mouth daily.     . pravastatin (PRAVACHOL) 40 MG tablet Take 1 tablet (40 mg total) by  mouth at bedtime. 90 tablet 3  . triamcinolone ointment (KENALOG) 0.5 % Apply 1 application topically daily as needed. 45 g 0  . insulin detemir (LEVEMIR) 100 UNIT/ML injection Inject 0.5 mLs (50 Units total) into the skin 2 (two) times daily. (Patient taking differently: Inject 40 Units into the skin 2 (two) times daily. ) 30 mL 5   No current facility-administered medications for this visit.   Allergies  Allergen Reactions  . Morphine Other (See Comments)    Confusion and not cooperative  . Lyrica [Pregabalin] Other (See Comments)    Personality changes  . Neurontin [Gabapentin]  Other (See Comments)    Personality changes  . Carbamazepine     REACTION: irritability  . Invokana [Canagliflozin] Other (See Comments)    Back Pain  . Metformin And Related     Renal function  . Nortriptyline Hcl     REACTION: confusion  . Oxycodone Hcl     confusion

## 2019-11-15 NOTE — Telephone Encounter (Signed)
Brian Strickland scheduled for follow up today.

## 2019-12-16 ENCOUNTER — Other Ambulatory Visit: Payer: Self-pay | Admitting: Family Medicine

## 2019-12-16 DIAGNOSIS — E1149 Type 2 diabetes mellitus with other diabetic neurological complication: Secondary | ICD-10-CM

## 2020-01-27 ENCOUNTER — Other Ambulatory Visit: Payer: Self-pay | Admitting: Family Medicine

## 2020-01-27 DIAGNOSIS — E1149 Type 2 diabetes mellitus with other diabetic neurological complication: Secondary | ICD-10-CM

## 2020-02-14 ENCOUNTER — Other Ambulatory Visit: Payer: Self-pay

## 2020-02-14 ENCOUNTER — Encounter: Payer: Self-pay | Admitting: Family Medicine

## 2020-02-14 ENCOUNTER — Ambulatory Visit (INDEPENDENT_AMBULATORY_CARE_PROVIDER_SITE_OTHER): Payer: PPO | Admitting: Family Medicine

## 2020-02-14 VITALS — BP 104/52 | HR 92 | Ht 74.0 in | Wt 301.0 lb

## 2020-02-14 DIAGNOSIS — I10 Essential (primary) hypertension: Secondary | ICD-10-CM

## 2020-02-14 DIAGNOSIS — E1149 Type 2 diabetes mellitus with other diabetic neurological complication: Secondary | ICD-10-CM | POA: Diagnosis not present

## 2020-02-14 DIAGNOSIS — E1121 Type 2 diabetes mellitus with diabetic nephropathy: Secondary | ICD-10-CM | POA: Diagnosis not present

## 2020-02-14 LAB — POCT GLYCOSYLATED HEMOGLOBIN (HGB A1C): Hemoglobin A1C: 7.4 % — AB (ref 4.0–5.6)

## 2020-02-14 MED ORDER — VICTOZA 18 MG/3ML ~~LOC~~ SOPN: PEN_INJECTOR | SUBCUTANEOUS | 3 refills | Status: DC

## 2020-02-14 NOTE — Progress Notes (Signed)
Established Patient Office Visit  Subjective:  Patient ID: Brian Strickland, male    DOB: June 12, 1946  Age: 74 y.o. MRN: [id number]  CC:  Chief Complaint  Patient presents with  . Diabetes  . Hypertension    HPI Brian Strickland presents for   Diabetes - no hypoglycemic events. No wounds or sores that are not healing well. No increased thirst or urination. Checking glucose at home. Taking medications as prescribed without any side effects.  Hypertension- Pt denies chest pain, SOB, dizziness, or heart palpitations.  Taking meds as directed w/o problems.  Denies medication side effects.      Past Medical History:  Diagnosis Date  . CAD (coronary artery disease)   . colon ca dx'd 3893/ 2006   colon/recurrent  . Diabetes mellitus    Type 2  . Hypertension   . 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 Urethral meatal stenosis    S/P dilatation (Dr. Geroge Baseman)    Past Surgical History:  Procedure Laterality Date  . cholectomy    . colon cancer  [phone]   recurrence  . CORONARY ARTERY BYPASS GRAFT    . REFRACTIVE SURGERY      Family History  Problem Relation Age of Onset  . Cancer Unknown        colon/family history    Social History   Socioeconomic History  . Marital status: Married    Spouse name: Not on file  . Number of children: Not on file  . Years of education: Not on file  . Highest education level: Not on file  Occupational History  . Not on file  Tobacco Use  . Smoking status: Never Smoker  . Smokeless tobacco: Never Used  Substance and Sexual Activity  . Alcohol use: Yes    Comment: Socially  . Drug use: No  . 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  Other Topics Concern  . Not on file  Social History Narrative  . Not on file   Social Determinants of Health   Financial Resource Strain:   . Difficulty  of Paying Living Expenses:   Food Insecurity:   . Worried About Charity fundraiser in the Last Year:   . Arboriculturist in the Last Year:   Transportation Needs:   . Film/video editor (Medical):   Marland Kitchen Lack of Transportation (Non-Medical):   Physical Activity:   . Days of Exercise per Week:   . Minutes of Exercise per Session:   Stress:   . Feeling of Stress :   Social Connections:   . Frequency of Communication with Friends and Family:   . Frequency of Social Gatherings with Friends and Family:   . Attends Religious Services:   . Active Member of Clubs or Organizations:   . Attends Archivist Meetings:   Marland Kitchen Marital Status:   Intimate Partner Violence:   . Fear of Current or Ex-Partner:   . Emotionally Abused:   Marland Kitchen Physically Abused:   . Sexually Abused:     Outpatient Medications Prior to Visit  Medication Sig Dispense Refill  . AMBULATORY NON FORMULARY MEDICATION Glucometer of patient and insurance choice.  Diagnosis: Diabetes 1 each 0  . aspirin 81 MG tablet Take 81 mg by mouth daily.      . ciprofloxacin (CIPRO) 500  MG tablet TAKE 1 TABLET (500 MG TOTAL) BY MOUTH 2 (TWO) TIMES DAILY. 14 tablet 3  . clobetasol ointment (TEMOVATE) 4.54 % Apply 1 application topically 2 (two) times daily. Give skin a break after 2 weeks. 60 g 0  . diclofenac sodium (VOLTAREN) 1 % GEL APPLY TOPICALLY 4 (FOUR) TIMES DAILY. 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 fexofenadine (ALLEGRA) 180 MG tablet Take 180 mg by mouth as needed.    . insulin aspart (NOVOLOG FLEXPEN) 100 UNIT/ML injection SSI TID with Meals. 30 mL 5  . insulin detemir (LEVEMIR) 100 UNIT/ML injection Inject 0.5 mLs (50 Units total) into the skin 2 (two) times daily. (Patient taking differently: Inject 40 Units into the skin 2 (two) times daily. ) 30 mL 5  . lisinopril (PRINIVIL,ZESTRIL) 20 MG tablet Take 20 mg by mouth daily.     . pravastatin (PRAVACHOL) 40 MG tablet Take 1 tablet (40 mg total) by mouth at  bedtime. 90 tablet 3  . triamcinolone ointment (KENALOG) 0.5 % Apply 1 application topically daily as needed. 45 g 0  . VICTOZA 18 MG/3ML SOPN DIAL AND INJECT UNDER THE SKIN 1.8 MG DAILY 9 pen 0   No facility-administered medications prior to visit.    Allergies  Allergen Reactions  . Morphine Other (See Comments)    Confusion and not cooperative  . Lyrica [Pregabalin] Other (See Comments)    Personality changes  . Neurontin [Gabapentin] Other (See Comments)    Personality changes  . Carbamazepine     REACTION: irritability  . Invokana [Canagliflozin] Other (See Comments)    Back Pain  . Metformin And Related     Renal function  . Nortriptyline Hcl     REACTION: confusion  . Oxycodone Hcl     confusion    ROS Review of Systems    Objective:    Physical Exam  Constitutional: He is oriented to person, place, and time. He appears well-developed and well-nourished.  HENT:  Head: Normocephalic and atraumatic.  Cardiovascular: Normal rate, regular rhythm and normal heart sounds.  Pulmonary/Chest: Effort normal and breath sounds normal.  Neurological: He is alert and oriented to person, place, and time.  Skin: Skin is warm and dry.  Psychiatric: He has a normal mood and affect. His behavior is normal.    BP (!) 104/52   Pulse 92   Ht 6' 2"  (1.88 m)   Wt (!) 301 lb (136.5 kg)   SpO2 98%   BMI 38.65 kg/m  Wt Readings from Last 3 Encounters:  02/14/20 (!) 301 lb (136.5 kg)  11/15/19 280 lb (127 kg)  12/28/18 297 lb (134.7 kg)     Health Maintenance Due  Topic Date Due  . FOOT EXAM  01/23/2019  . OPHTHALMOLOGY EXAM  12/31/2019    There are no preventive care reminders to display for this patient.  Lab Results  Component Value Date   TSH 3.77 12/29/2018   Lab Results  Component Value Date   WBC 7.0 12/29/2018   HGB 16.0 12/29/2018   HCT 50.7 (H) 11/17/2014   MCV 96.9 11/17/2014   PLT 213 12/29/2018   Lab Results  Component Value Date   NA 138  12/29/2018   K 4.5 12/29/2018   CHLORIDE 103 11/17/2014   CO2 30 12/28/2018   GLUCOSE 150 (H) 12/28/2018   BUN 20 12/28/2018   CREATININE 1.4 (A) 12/29/2018   BILITOT 0.5 06/30/2018   ALKPHOS 73 04/21/2017   AST 22  12/29/2018   ALT 38 12/29/2018   PROT 7.6 06/30/2018   ALBUMIN 4.0 04/21/2017   CALCIUM 9.9 12/28/2018   ANIONGAP 11 11/17/2014   EGFR 51 (L) 11/17/2014   Lab Results  Component Value Date   CHOL 179 12/29/2018   Lab Results  Component Value Date   HDL 43 12/29/2018   Lab Results  Component Value Date   LDLCALC 87 12/29/2018   Lab Results  Component Value Date   TRIG 244 (A) 12/29/2018   Lab Results  Component Value Date   CHOLHDL 4.2 06/30/2018   Lab Results  Component Value Date   HGBA1C 7.4 (A) 02/14/2020      Assessment & Plan:   Problem List Items Addressed This Visit      Cardiovascular and Mediastinum   HYPERTENSION, BENIGN SYSTEMIC    BP is actually low today and was a yr ago. Will have him cut the lisinopril in half.  Monitor BP at home or f/u here for nurse visit.         Endocrine   Type 2 diabetes mellitus with neurological complications (HCC) - Primary    Improved but not at goal. Increase Levemir to 42 units try that for 2 weeks if blood sugars are still running greater than 130 in the mornings and increase to 44 units.  He also plans on getting back into some regular exercise which should help as well.      Relevant Medications   liraglutide (VICTOZA) 18 MG/3ML SOPN   Other Relevant Orders   POCT glycosylated hemoglobin (Hb A1C) (Completed)   Diabetic nephropathy with proteinuria (HCC)    Continue lisinopril but will dec dose due to low BPs.       Relevant Medications   liraglutide (VICTOZA) 18 MG/3ML SOPN      Meds ordered this encounter  Medications  . liraglutide (VICTOZA) 18 MG/3ML SOPN    Sig: DIAL AND INJECT UNDER THE SKIN 1.8 MG DAILY    Dispense:  12 pen    Refill:  3    Follow-up: Return in about 3  months (around 05/15/2020) for Diabetes follow-up.    Beatrice Lecher, MD

## 2020-02-14 NOTE — Assessment & Plan Note (Signed)
Continue lisinopril but will dec dose due to low BPs.

## 2020-02-14 NOTE — Assessment & Plan Note (Signed)
BP is actually low today and was a yr ago. Will have him cut the lisinopril in half.  Monitor BP at home or f/u here for nurse visit.

## 2020-02-14 NOTE — Assessment & Plan Note (Signed)
Improved but not at goal. Increase Levemir to 42 units try that for 2 weeks if blood sugars are still running greater than 130 in the mornings and increase to 44 units.  He also plans on getting back into some regular exercise which should help as well.

## 2020-05-15 ENCOUNTER — Encounter: Payer: Self-pay | Admitting: Family Medicine

## 2020-05-15 ENCOUNTER — Ambulatory Visit (INDEPENDENT_AMBULATORY_CARE_PROVIDER_SITE_OTHER): Payer: PPO | Admitting: Family Medicine

## 2020-05-15 ENCOUNTER — Other Ambulatory Visit: Payer: Self-pay

## 2020-05-15 VITALS — BP 110/50 | HR 95 | Ht 74.0 in | Wt 304.0 lb

## 2020-05-15 DIAGNOSIS — I10 Essential (primary) hypertension: Secondary | ICD-10-CM | POA: Diagnosis not present

## 2020-05-15 DIAGNOSIS — E1121 Type 2 diabetes mellitus with diabetic nephropathy: Secondary | ICD-10-CM

## 2020-05-15 DIAGNOSIS — E1149 Type 2 diabetes mellitus with other diabetic neurological complication: Secondary | ICD-10-CM

## 2020-05-15 LAB — POCT GLYCOSYLATED HEMOGLOBIN (HGB A1C): Hemoglobin A1C: 7.2 % — AB (ref 4.0–5.6)

## 2020-05-15 MED ORDER — VICTOZA 18 MG/3ML ~~LOC~~ SOPN: PEN_INJECTOR | SUBCUTANEOUS | 3 refills | Status: DC

## 2020-05-15 NOTE — Assessment & Plan Note (Signed)
Improved. A1C down to 7.2. great work!!!!. Weight is up 3 lbs.

## 2020-05-15 NOTE — Progress Notes (Signed)
Established Patient Office Visit  Subjective:  Patient ID: Brian Strickland, male    DOB: Nov 22, 1945  Age: 74 y.o. MRN: [id number]  CC:  Chief Complaint  Patient presents with  . Diabetes    HPI Brian Strickland presents for   Diabetes - no hypoglycemic events. No wounds or sores that are not healing well. No increased thirst or urination. Checking glucose at home. Taking medications as prescribed without any side effects. Taking 50 Units of Levemir BID  And using Novolog with breakfast and evening meal and occ prn SSI after lunch.    Hypertension- Pt denies chest pain, SOB, dizziness, or heart palpitations.  Taking meds as directed w/o problems.  Denies medication side effects.    Had labs done at [facility] about a week ago.  Will call for those.     Past Medical History:  Diagnosis Date  . CAD (coronary artery disease)   . colon ca dx'd 7048/ 2006   colon/recurrent  . Diabetes mellitus    Type 2  . Hypertension   . 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 Urethral meatal stenosis    S/P dilatation (Dr. Geroge Baseman)    Past Surgical History:  Procedure Laterality Date  . cholectomy    . colon cancer  [phone]   recurrence  . CORONARY ARTERY BYPASS GRAFT    . REFRACTIVE SURGERY      Family History  Problem Relation Age of Onset  . Cancer Unknown        colon/family history    Social History   Socioeconomic History  . Marital status: Married    Spouse name: Not on file  . Number of children: Not on file  . Years of education: Not on file  . Highest education level: Not on file  Occupational History  . Not on file  Tobacco Use  . Smoking status: Never Smoker  . Smokeless tobacco: Never Used  Substance and Sexual Activity  . Alcohol use: Yes    Comment: Socially  . Drug use: No  . 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   Other Topics Concern  . Not on file  Social History Narrative  . Not on file   Social Determinants of Health   Financial Resource Strain:   . Difficulty of Paying Living Expenses:   Food Insecurity:   . Worried About Charity fundraiser in the Last Year:   . Arboriculturist in the Last Year:   Transportation Needs:   . Film/video editor (Medical):   Marland Kitchen Lack of Transportation (Non-Medical):   Physical Activity:   . Days of Exercise per Week:   . Minutes of Exercise per Session:   Stress:   . Feeling of Stress :   Social Connections:   . Frequency of Communication with Friends and Family:   . Frequency of Social Gatherings with Friends and Family:   . Attends Religious Services:   . Active Member of Clubs or Organizations:   . Attends Archivist Meetings:   Marland Kitchen Marital Status:   Intimate Partner Violence:   . Fear of Current or Ex-Partner:   . Emotionally Abused:   Marland Kitchen Physically Abused:   . Sexually Abused:     Outpatient Medications Prior to Visit  Medication Sig Dispense Refill  . AMBULATORY  NON FORMULARY MEDICATION Glucometer of patient and insurance choice.  Diagnosis: Diabetes 1 each 0  . aspirin 81 MG tablet Take 81 mg by mouth daily.      . ciprofloxacin (CIPRO) 500 MG tablet TAKE 1 TABLET (500 MG TOTAL) BY MOUTH 2 (TWO) TIMES DAILY. 14 tablet 3  . clobetasol ointment (TEMOVATE) 7.41 % Apply 1 application topically 2 (two) times daily. Give skin a break after 2 weeks. 60 g 0  . diclofenac sodium (VOLTAREN) 1 % GEL APPLY TOPICALLY 4 (FOUR) TIMES DAILY. 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 fexofenadine (ALLEGRA) 180 MG tablet Take 180 mg by mouth as needed.    . insulin aspart (NOVOLOG FLEXPEN) 100 UNIT/ML injection SSI TID with Meals. 30 mL 5  . insulin detemir (LEVEMIR) 100 UNIT/ML injection Inject 0.5 mLs (50 Units total) into the skin 2 (two) times daily. (Patient taking differently: Inject 40 Units into the skin 2 (two) times daily. ) 30 mL 5   . lisinopril (PRINIVIL,ZESTRIL) 20 MG tablet Take 20 mg by mouth daily.     . pravastatin (PRAVACHOL) 40 MG tablet Take 1 tablet (40 mg total) by mouth at bedtime. 90 tablet 3  . triamcinolone ointment (KENALOG) 0.5 % Apply 1 application topically daily as needed. 45 g 0  . liraglutide (VICTOZA) 18 MG/3ML SOPN DIAL AND INJECT UNDER THE SKIN 1.8 MG DAILY 12 pen 3   No facility-administered medications prior to visit.    Allergies  Allergen Reactions  . Morphine Other (See Comments)    Confusion and not cooperative  . Lyrica [Pregabalin] Other (See Comments)    Personality changes  . Neurontin [Gabapentin] Other (See Comments)    Personality changes  . Carbamazepine     REACTION: irritability  . Invokana [Canagliflozin] Other (See Comments)    Back Pain  . Metformin And Related     Renal function  . Nortriptyline Hcl     REACTION: confusion  . Oxycodone Hcl     confusion    ROS Review of Systems    Objective:    Physical Exam Constitutional:      Appearance: He is well-developed.  HENT:     Head: Normocephalic and atraumatic.  Cardiovascular:     Rate and Rhythm: Normal rate and regular rhythm.     Heart sounds: Normal heart sounds.  Pulmonary:     Effort: Pulmonary effort is normal.     Breath sounds: Normal breath sounds.  Skin:    General: Skin is warm and dry.  Neurological:     Mental Status: He is alert and oriented to person, place, and time.  Psychiatric:        Behavior: Behavior normal.     BP (!) 110/50   Pulse 95   Ht 6' 2"  (1.88 m)   Wt (!) 304 lb (137.9 kg)   SpO2 97%   BMI 39.03 kg/m  Wt Readings from Last 3 Encounters:  05/15/20 (!) 304 lb (137.9 kg)  02/14/20 (!) 301 lb (136.5 kg)  11/15/19 280 lb (127 kg)     There are no preventive care reminders to display for this patient.  There are no preventive care reminders to display for this patient.  Lab Results  Component Value Date   TSH 3.77 12/29/2018   Lab Results   Component Value Date   WBC 7.0 12/29/2018   HGB 16.0 12/29/2018   HCT 50.7 (H) 11/17/2014   MCV 96.9 11/17/2014   PLT  213 12/29/2018   Lab Results  Component Value Date   NA 138 12/29/2018   K 4.5 12/29/2018   CHLORIDE 103 11/17/2014   CO2 30 12/28/2018   GLUCOSE 150 (H) 12/28/2018   BUN 20 12/28/2018   CREATININE 1.4 (A) 12/29/2018   BILITOT 0.5 06/30/2018   ALKPHOS 73 04/21/2017   AST 22 12/29/2018   ALT 38 12/29/2018   PROT 7.6 06/30/2018   ALBUMIN 4.0 04/21/2017   CALCIUM 9.9 12/28/2018   ANIONGAP 11 11/17/2014   EGFR 51 (L) 11/17/2014   Lab Results  Component Value Date   CHOL 179 12/29/2018   Lab Results  Component Value Date   HDL 43 12/29/2018   Lab Results  Component Value Date   LDLCALC 87 12/29/2018   Lab Results  Component Value Date   TRIG 244 (A) 12/29/2018   Lab Results  Component Value Date   CHOLHDL 4.2 06/30/2018   Lab Results  Component Value Date   HGBA1C 7.2 (A) 05/15/2020      Assessment & Plan:   Problem List Items Addressed This Visit      Cardiovascular and Mediastinum   HYPERTENSION, BENIGN SYSTEMIC    Well controlled. Monitor for sxs of low BP.  Ok to cut lisinopril in half if feeling lightheaded or dizzy.  Continue current regimen. Follow up in  4 mo        Endocrine   Type 2 diabetes mellitus with neurological complications ([place]) - Primary    Improved. A1C down to 7.2. great work!!!!. Weight is up 3 lbs.       Relevant Medications   liraglutide (VICTOZA) 18 MG/3ML SOPN   Other Relevant Orders   POCT glycosylated hemoglobin (Hb A1C) (Completed)   Diabetic nephropathy with proteinuria ([place])    On an ACEi.       Relevant Medications   liraglutide (VICTOZA) 18 MG/3ML SOPN     May need to use mail order for 90 days supply to be covered for his Victoza. All other meds are covered at the [place].   Meds ordered this encounter  Medications  . liraglutide (VICTOZA) 18 MG/3ML SOPN    Sig: DIAL AND INJECT UNDER THE  SKIN 1.8 MG DAILY    Dispense:  27 pen    Refill:  3    Follow-up: Return in about 4 months (around 09/15/2020) for Diabetes follow-up.    Beatrice Lecher, MD

## 2020-05-15 NOTE — Assessment & Plan Note (Signed)
On an ACEi.

## 2020-05-15 NOTE — Assessment & Plan Note (Addendum)
Well controlled. Monitor for sxs of low BP.  Ok to cut lisinopril in half if feeling lightheaded or dizzy.  Continue current regimen. Follow up in  4 mo

## 2020-06-04 NOTE — Telephone Encounter (Signed)
Patient advised, no labs have been sent to our office.

## 2020-07-03 ENCOUNTER — Encounter: Payer: Self-pay | Admitting: Family Medicine

## 2020-07-06 ENCOUNTER — Encounter: Payer: Self-pay | Admitting: Family Medicine

## 2020-07-06 NOTE — Telephone Encounter (Signed)
Kenney Houseman, can we call the [facility] and get those faxed over so we can get them entered in.  I really do not remember seeing them or not.

## 2020-07-13 ENCOUNTER — Other Ambulatory Visit: Payer: Self-pay | Admitting: Family Medicine

## 2020-07-31 ENCOUNTER — Encounter: Payer: Self-pay | Admitting: Family Medicine

## 2020-08-19 ENCOUNTER — Encounter: Payer: Self-pay | Admitting: Family Medicine

## 2020-08-20 ENCOUNTER — Other Ambulatory Visit: Payer: Self-pay | Admitting: *Deleted

## 2020-08-20 DIAGNOSIS — C189 Malignant neoplasm of colon, unspecified: Secondary | ICD-10-CM

## 2020-08-20 MED ORDER — PRAVASTATIN SODIUM 40 MG PO TABS: 40.0000 mg | ORAL_TABLET | Freq: Every day | ORAL | 3 refills | Status: AC

## 2020-09-17 ENCOUNTER — Ambulatory Visit (INDEPENDENT_AMBULATORY_CARE_PROVIDER_SITE_OTHER): Payer: PPO | Admitting: Family Medicine

## 2020-09-17 ENCOUNTER — Encounter: Payer: Self-pay | Admitting: Family Medicine

## 2020-09-17 VITALS — BP 121/45 | HR 77 | Ht 74.0 in | Wt 307.0 lb

## 2020-09-17 DIAGNOSIS — Z125 Encounter for screening for malignant neoplasm of prostate: Secondary | ICD-10-CM

## 2020-09-17 DIAGNOSIS — N1832 Chronic kidney disease, stage 3b: Secondary | ICD-10-CM

## 2020-09-17 DIAGNOSIS — S51002A Unspecified open wound of left elbow, initial encounter: Secondary | ICD-10-CM

## 2020-09-17 DIAGNOSIS — E1149 Type 2 diabetes mellitus with other diabetic neurological complication: Secondary | ICD-10-CM

## 2020-09-17 DIAGNOSIS — I10 Essential (primary) hypertension: Secondary | ICD-10-CM

## 2020-09-17 LAB — POCT GLYCOSYLATED HEMOGLOBIN (HGB A1C): Hemoglobin A1C: 7.1 % — AB (ref 4.0–5.6)

## 2020-09-17 MED ORDER — DOXYCYCLINE HYCLATE 100 MG PO TABS: 100.0000 mg | ORAL_TABLET | Freq: Two times a day (BID) | ORAL | 0 refills | Status: DC

## 2020-09-17 MED ORDER — CIPROFLOXACIN HCL 500 MG PO TABS: 500.0000 mg | ORAL_TABLET | Freq: Two times a day (BID) | ORAL | 6 refills | Status: DC

## 2020-09-17 NOTE — Progress Notes (Signed)
Established Patient Office Visit  Subjective:  Patient ID: Brian Strickland, male    DOB: 09-Dec-1945  Age: 74 y.o. MRN: [id number]  CC:  Chief Complaint  Patient presents with  . Diabetes  a  HPI [name] presents for   Diabetes - no hypoglycemic events. No wounds or sores that are not healing well. No increased thirst or urination. Checking glucose at home. Taking medications as prescribed without any side effects.  Reports that he has had some blood work done at the [place].  He also has a new wound on his left elbow that he would like me to look at today he has been cleaning it with peroxide.  He has not member any specific injury or trauma he just remembers looking a little bit discolored and then suddenly it started draining about 4 days ago.  They have been cleaning it with peroxide.  He has not noticed any pus just a little serous drainage.  No fevers or chills.  He says it really has not been painful or sore.   Past Medical History:  Diagnosis Date  . CAD (coronary artery disease)   . colon ca dx'd 3532/ 2006   colon/recurrent  . Diabetes mellitus    Type 2  . Hypertension   . 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 Urethral meatal stenosis    S/P dilatation (Dr. Geroge Baseman)    Past Surgical History:  Procedure Laterality Date  . cholectomy    . colon cancer  [phone]   recurrence  . CORONARY ARTERY BYPASS GRAFT    . REFRACTIVE SURGERY      Family History  Problem Relation Age of Onset  . Cancer Unknown        colon/family history    Social History   Socioeconomic History  . Marital status: Married    Spouse name: Not on file  . Number of children: Not on file  . Years of education: Not on file  . Highest education level: Not on file  Occupational History  . Not on file  Tobacco Use  . Smoking status: Never Smoker  . Smokeless tobacco: Never Used  Substance and Sexual Activity  . Alcohol use: Yes    Comment: Socially  .  Drug use: No  . 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  Other Topics Concern  . Not on file  Social History Narrative  . Not on file   Social Determinants of Health   Financial Resource Strain:   . Difficulty of Paying Living Expenses: Not on file  Food Insecurity:   . Worried About Charity fundraiser in the Last Year: Not on file  . Ran Out of Food in the Last Year: Not on file  Transportation Needs:   . Lack of Transportation (Medical): Not on file  . Lack of Transportation (Non-Medical): Not on file  Physical Activity:   . Days of Exercise per Week: Not on file  . Minutes of Exercise per Session: Not on file  Stress:   . Feeling of Stress : Not on file  Social Connections:   . Frequency of Communication with Friends and Family: Not on file  . Frequency of Social Gatherings with Friends and Family: Not on file  . Attends Religious Services: Not on file  . Active Member of  Clubs or Organizations: Not on file  . Attends Archivist Meetings: Not on file  . Marital Status: Not on file  Intimate Partner Violence:   . Fear of Current or Ex-Partner: Not on file  . Emotionally Abused: Not on file  . Physically Abused: Not on file  . Sexually Abused: Not on file    Outpatient Medications Prior to Visit  Medication Sig Dispense Refill  . AMBULATORY NON FORMULARY MEDICATION Glucometer of patient and insurance choice.  Diagnosis: Diabetes 1 each 0  . aspirin 81 MG tablet Take 81 mg by mouth daily.      . clobetasol ointment (TEMOVATE) 0.96 % Apply 1 application topically 2 (two) times daily. Give skin a break after 2 weeks. 60 g 0  . diclofenac sodium (VOLTAREN) 1 % GEL APPLY TOPICALLY 4 (FOUR) TIMES DAILY. 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 fexofenadine (ALLEGRA) 180 MG tablet Take 180 mg by mouth as needed.    . insulin aspart (NOVOLOG  FLEXPEN) 100 UNIT/ML injection SSI TID with Meals. 30 mL 5  . insulin detemir (LEVEMIR) 100 UNIT/ML injection Inject 0.5 mLs (50 Units total) into the skin 2 (two) times daily. 30 mL 5  . liraglutide (VICTOZA) 18 MG/3ML SOPN DIAL AND INJECT UNDER THE SKIN 1.8 MG DAILY 27 pen 3  . lisinopril (PRINIVIL,ZESTRIL) 20 MG tablet Take 20 mg by mouth daily.     . pravastatin (PRAVACHOL) 40 MG tablet Take 1 tablet (40 mg total) by mouth at bedtime. 90 tablet 3  . triamcinolone ointment (KENALOG) 0.5 % Apply 1 application topically daily as needed. 45 g 0  . ciprofloxacin (CIPRO) 500 MG tablet TAKE ONE TABLET BY MOUTH TWICE DAILY 14 tablet 6   No facility-administered medications prior to visit.    Allergies  Allergen Reactions  . Morphine Other (See Comments)    Confusion and not cooperative  . Lyrica [Pregabalin] Other (See Comments)    Personality changes  . Neurontin [Gabapentin] Other (See Comments)    Personality changes  . Carbamazepine     REACTION: irritability  . Invokana [Canagliflozin] Other (See Comments)    Back Pain  . Metformin And Related     Renal function  . Nortriptyline Hcl     REACTION: confusion  . Oxycodone Hcl     confusion    ROS Review of Systems    Objective:    Physical Exam Constitutional:      Appearance: He is well-developed.  HENT:     Head: Normocephalic and atraumatic.  Cardiovascular:     Rate and Rhythm: Normal rate and regular rhythm.     Heart sounds: Normal heart sounds.  Pulmonary:     Effort: Pulmonary effort is normal.     Breath sounds: Normal breath sounds.  Skin:    General: Skin is warm and dry.     Comments: On his left elbow he has a open wound that most looks like it may have initially started as a crack in the skin but has widened because it is on a flexural space.  But it does look clean and open.  Small amount of serous drainage in place.  Neurological:     Mental Status: He is alert and oriented to person, place, and time.   Psychiatric:        Behavior: Behavior normal.     BP (!) 121/45   Pulse 77   Ht _0  (1.88 m)   Wt Marland Kitchen)  307 lb (139.3 kg)   SpO2 98%   BMI 39.42 kg/m  Wt Readings from Last 3 Encounters:  09/17/20 (!) 307 lb (139.3 kg)  05/15/20 (!) 304 lb (137.9 kg)  02/14/20 (!) 301 lb (136.5 kg)     Health Maintenance Due  Topic Date Due  . OPHTHALMOLOGY EXAM  09/08/2020    There are no preventive care reminders to display for this patient.  Lab Results  Component Value Date   TSH 3.77 12/29/2018   Lab Results  Component Value Date   WBC 5.6 09/19/2020   HGB 15.3 09/19/2020   HCT 47.0 09/19/2020   MCV 97.9 09/19/2020   PLT 204 09/19/2020   Lab Results  Component Value Date   NA 139 09/19/2020   K 4.8 09/19/2020   CHLORIDE 103 11/17/2014   CO2 29 09/19/2020   GLUCOSE 195 (H) 09/19/2020   BUN 25 09/19/2020   CREATININE 1.39 (H) 09/19/2020   BILITOT 0.6 09/19/2020   ALKPHOS 73 04/21/2017   AST 20 09/19/2020   ALT 17 09/19/2020   PROT 7.1 09/19/2020   ALBUMIN 4.0 04/21/2017   CALCIUM 9.5 09/19/2020   ANIONGAP 11 11/17/2014   EGFR 51 (L) 11/17/2014   Lab Results  Component Value Date   CHOL 160 09/19/2020   Lab Results  Component Value Date   HDL 38 (L) 09/19/2020   Lab Results  Component Value Date   LDLCALC 98 09/19/2020   Lab Results  Component Value Date   TRIG 147 09/19/2020   Lab Results  Component Value Date   CHOLHDL 4.2 09/19/2020   Lab Results  Component Value Date   HGBA1C 7.2 (A) 05/15/2020      Assessment & Plan:   Problem List Items Addressed This Visit      Cardiovascular and Mediastinum   HYPERTENSION, BENIGN SYSTEMIC    Well controlled. Continue current regimen. Follow up in  6 months.       Relevant Orders   CBC (Completed)   COMPLETE METABOLIC PANEL WITH GFR (Completed)   Lipid panel (Completed)   PSA (Completed)     Endocrine   Type 2 diabetes mellitus with neurological complications (HCC) - Primary    A1c looks  good today at 7.1 its been getting progressively better.  It was 7.6, then down to 7.4, then 7.2, then 7.1.  He is really try to work harder making some changes he knows he needs to work on getting some more exercise.  We discussed stationary bike would be a good option.  He tries to walk but sometimes he actually feels worse after walking.      Relevant Orders   POCT glycosylated hemoglobin (Hb A1C)   CBC (Completed)   COMPLETE METABOLIC PANEL WITH GFR (Completed)   Lipid panel (Completed)   PSA (Completed)     Genitourinary   Chronic kidney disease (CKD) stage G3b/A2, moderately decreased glomerular filtration rate (GFR) between 30-44 mL/min/1.73 square meter and albuminuria creatinine ratio between 30-299 mg/g (HCC)    Due to recheck renal function he did have some labs done earlier this year at the [place] but we do not have a copy of those.      Relevant Orders   COMPLETE METABOLIC PANEL WITH GFR (Completed)    Other Visit Diagnoses    Screening for prostate cancer       Relevant Orders   PSA (Completed)   Open wound of left elbow, initial encounter  Left elbow, open wound-just some serous drainage on exam today that there is a significant mount of erythema around the wound.  No induration.  We will go ahead and place on oral antibiotics avoid any pressure on the area.  Clean with antibacterial soap pat dry and apply Vaseline daily to moisturize the area.  If not healing over the next 2 weeks then please let us know.  Meds ordered this encounter  Medications  . ciprofloxacin (CIPRO) 500 MG tablet    Sig: Take 1 tablet (500 mg total) by mouth 2 (two) times daily.    Dispense:  14 tablet    Refill:  6  . doxycycline (VIBRA-TABS) 100 MG tablet    Sig: Take 1 tablet (100 mg total) by mouth 2 (two) times daily.    Dispense:  14 tablet    Refill:  0    Follow-up: Return in about 3 months (around 12/18/2020) for Diabetes follow-up.    Beatrice Lecher, MD

## 2020-09-17 NOTE — Assessment & Plan Note (Signed)
Well controlled. Continue current regimen. Follow up in  6 months.  

## 2020-09-17 NOTE — Assessment & Plan Note (Signed)
A1c looks good today at 7.1 its been getting progressively better.  It was 7.6, then down to 7.4, then 7.2, then 7.1.  He is really try to work harder making some changes he knows he needs to work on getting some more exercise.  We discussed stationary bike would be a good option.  He tries to walk but sometimes he actually feels worse after walking.

## 2020-09-17 NOTE — Patient Instructions (Signed)
We will go ahead and place on oral antibiotics. Please  avoid any pressure on the area.  Clean with antibacterial soap pat dry and apply Vaseline daily to moisturize the area.  If not healing over the next 2 weeks then please let us know.

## 2020-09-17 NOTE — Assessment & Plan Note (Signed)
Due to recheck renal function he did have some labs done earlier this year at the [place] but we do not have a copy of those.

## 2020-09-19 DIAGNOSIS — I10 Essential (primary) hypertension: Secondary | ICD-10-CM | POA: Diagnosis not present

## 2020-09-19 DIAGNOSIS — E1149 Type 2 diabetes mellitus with other diabetic neurological complication: Secondary | ICD-10-CM | POA: Diagnosis not present

## 2020-09-19 DIAGNOSIS — N1832 Chronic kidney disease, stage 3b: Secondary | ICD-10-CM | POA: Diagnosis not present

## 2020-09-19 DIAGNOSIS — Z125 Encounter for screening for malignant neoplasm of prostate: Secondary | ICD-10-CM | POA: Diagnosis not present

## 2020-09-19 LAB — COMPLETE METABOLIC PANEL WITH GFR
AG Ratio: 1.4 (calc) (ref 1.0–2.5)
ALT: 17 U/L (ref 9–46)
AST: 20 U/L (ref 10–35)
Albumin: 4.2 g/dL (ref 3.6–5.1)
Alkaline phosphatase (APISO): 53 U/L (ref 35–144)
BUN/Creatinine Ratio: 18 (calc) (ref 6–22)
BUN: 25 mg/dL (ref 7–25)
CO2: 29 mmol/L (ref 20–32)
Calcium: 9.5 mg/dL (ref 8.6–10.3)
Chloride: 103 mmol/L (ref 98–110)
Creat: 1.39 mg/dL — ABNORMAL HIGH (ref 0.70–1.18)
GFR, Est African American: 57 mL/min/{1.73_m2} — ABNORMAL LOW (ref >=60)
GFR, Est Non African American: 49 mL/min/{1.73_m2} — ABNORMAL LOW (ref >=60)
Globulin: 2.9 g/dL (calc) (ref 1.9–3.7)
Glucose, Bld: 195 mg/dL — ABNORMAL HIGH (ref 65–99)
Potassium: 4.8 mmol/L (ref 3.5–5.3)
Sodium: 139 mmol/L (ref 135–146)
Total Bilirubin: 0.6 mg/dL (ref 0.2–1.2)
Total Protein: 7.1 g/dL (ref 6.1–8.1)

## 2020-09-19 LAB — CBC
HCT: 47 % (ref 38.5–50.0)
Hemoglobin: 15.3 g/dL (ref 13.2–17.1)
MCH: 31.9 pg (ref 27.0–33.0)
MCHC: 32.6 g/dL (ref 32.0–36.0)
MCV: 97.9 fL (ref 80.0–100.0)
MPV: 12.1 fL (ref 7.5–12.5)
Platelets: 204 10*3/uL (ref 140–400)
RBC: 4.8 10*6/uL (ref 4.20–5.80)
RDW: 13 % (ref 11.0–15.0)
WBC: 5.6 10*3/uL (ref 3.8–10.8)

## 2020-09-19 LAB — LIPID PANEL
Cholesterol: 160 mg/dL (ref <200)
HDL: 38 mg/dL — ABNORMAL LOW (ref >=40)
LDL Cholesterol (Calc): 98 mg/dL (calc)
Non-HDL Cholesterol (Calc): 122 mg/dL (calc) (ref <130)
Total CHOL/HDL Ratio: 4.2 (calc) (ref <5.0)
Triglycerides: 147 mg/dL (ref <150)

## 2020-09-19 LAB — PSA: PSA: 5.58 ng/mL — ABNORMAL HIGH (ref <=4.0)

## 2020-09-20 ENCOUNTER — Encounter: Payer: Self-pay | Admitting: Family Medicine

## 2020-09-20 NOTE — Addendum Note (Signed)
Addended by: Teddy Spike on: 09/20/2020 08:36 AM   Modules accepted: Orders

## 2020-11-13 ENCOUNTER — Other Ambulatory Visit: Payer: Self-pay | Admitting: Family Medicine

## 2020-11-13 DIAGNOSIS — E1149 Type 2 diabetes mellitus with other diabetic neurological complication: Secondary | ICD-10-CM

## 2020-12-18 ENCOUNTER — Ambulatory Visit: Payer: PPO | Admitting: Family Medicine

## 2020-12-19 ENCOUNTER — Encounter: Payer: Self-pay | Admitting: Family Medicine

## 2020-12-19 ENCOUNTER — Other Ambulatory Visit: Payer: Self-pay

## 2020-12-19 ENCOUNTER — Ambulatory Visit (INDEPENDENT_AMBULATORY_CARE_PROVIDER_SITE_OTHER): Payer: PPO | Admitting: Family Medicine

## 2020-12-19 VITALS — BP 116/59 | HR 88 | Ht 74.0 in | Wt 306.0 lb

## 2020-12-19 DIAGNOSIS — I10 Essential (primary) hypertension: Secondary | ICD-10-CM | POA: Diagnosis not present

## 2020-12-19 DIAGNOSIS — E7800 Pure hypercholesterolemia, unspecified: Secondary | ICD-10-CM

## 2020-12-19 DIAGNOSIS — E1149 Type 2 diabetes mellitus with other diabetic neurological complication: Secondary | ICD-10-CM

## 2020-12-19 LAB — POCT GLYCOSYLATED HEMOGLOBIN (HGB A1C): Hemoglobin A1C: 7.5 % — AB (ref 4.0–5.6)

## 2020-12-19 MED ORDER — OZEMPIC (0.25 OR 0.5 MG/DOSE) 2 MG/1.5ML ~~LOC~~ SOPN: 0.2500 mg | PEN_INJECTOR | SUBCUTANEOUS | 2 refills | Status: DC

## 2020-12-19 NOTE — Assessment & Plan Note (Addendum)
Uncontrolled.  Hemoglobin A1c went up to 7.5.  We discussed options.  Really work on cutting back on bread and starting to get back into his walking routine I think it makes a big difference.  He usually tries to walk between two thirds to a mile per day.  So just encouraged him to get back into that.. Continue current regimen. Follow up in  3 months.  In addition I would like to try switching his Victoza to Ozempic.  He just filled a 90-day supply about a month ago so just encouraged him to finish that up and then we can try switching to Ozempic.  I will see him back in 3 months.

## 2020-12-19 NOTE — Assessment & Plan Note (Signed)
Well controlled. Continue current regimen. Follow up in  6 mo  

## 2020-12-19 NOTE — Progress Notes (Signed)
Established Patient Office Visit  Subjective:  Patient ID: Brian Strickland, male    DOB: February 21, 1946  Age: 75 y.o. MRN: [id number]  CC:  Chief Complaint  Patient presents with  . Diabetes    HPI Brian Strickland presents for follow-up for diabetes and hypertension.  He did have some blood work done at the [place] recently in December.  He had a metabolic performed creatinine was 1.5 with a mildly elevated BUN of 24.  Thyroid look great with a TSH of 1.5.  Vitamin B12 was good with 481.  Of her enzymes were normal.  Ferritin looked great.  Cholesterol in June looked good with an LDL of 130.  He is o n pravastatin 40 mg.  He has not been walking as much as he was in the fall he does not really like to get out as much of the cold weather plus his wife just had surgery so she is he has been taking care of her.  He is also been doing more the cooking and admits he has been eating a lot more bread.  He is using 40 units of Levemir twice a day.  And when he uses a sliding scale he usually does about 30 units.  Past Medical History:  Diagnosis Date  . CAD (coronary artery disease)   . colon ca dx'd 6270/ 2006   colon/recurrent  . Diabetes mellitus    Type 2  . Hypertension   . 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 Urethral meatal stenosis    S/P dilatation (Dr. Geroge Baseman)    Past Surgical History:  Procedure Laterality Date  . cholectomy    . colon cancer  [phone]   recurrence  . CORONARY ARTERY BYPASS GRAFT    . REFRACTIVE SURGERY      Family History  Problem Relation Age of Onset  . Cancer Other        colon/family history    Social History   Socioeconomic History  . Marital status: Married    Spouse name: Not on file  . Number of children: Not on file  . Years of education: Not on file  . Highest education level: Not on file  Occupational History  . Not on file  Tobacco Use  . Smoking status: Never Smoker  . Smokeless tobacco: Never Used   Substance and Sexual Activity  . Alcohol use: Yes    Comment: Socially  . Drug use: No  . 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  Other Topics Concern  . Not on file  Social History Narrative  . Not on file   Social Determinants of Health   Financial Resource Strain: Not on file  Food Insecurity: Not on file  Transportation Needs: Not on file  Physical Activity: Not on file  Stress: Not on file  Social Connections: Not on file  Intimate Partner Violence: Not on file    Outpatient Medications Prior to Visit  Medication Sig Dispense Refill  . AMBULATORY NON FORMULARY MEDICATION Glucometer of patient and insurance choice.  Diagnosis: Diabetes 1 each 0  . aspirin 81 MG tablet Take 81 mg by mouth daily.      . clobetasol ointment (TEMOVATE) 8.18 % Apply 1 application topically 2 (two) times daily. Give skin a break after 2 weeks. 60 g 0  .  diclofenac sodium (VOLTAREN) 1 % GEL APPLY TOPICALLY 4 (FOUR) TIMES DAILY. 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 fexofenadine (ALLEGRA) 180 MG tablet Take 180 mg by mouth as needed.    . insulin aspart (NOVOLOG FLEXPEN) 100 UNIT/ML injection SSI TID with Meals. 30 mL 5  . insulin detemir (LEVEMIR) 100 UNIT/ML injection Inject 0.5 mLs (50 Units total) into the skin 2 (two) times daily. 30 mL 5  . lisinopril (PRINIVIL,ZESTRIL) 20 MG tablet Take 20 mg by mouth daily.    . pravastatin (PRAVACHOL) 40 MG tablet Take 1 tablet (40 mg total) by mouth at bedtime. 90 tablet 3  . triamcinolone ointment (KENALOG) 0.5 % Apply 1 application topically daily as needed. 45 g 0  . liraglutide (VICTOZA) 18 MG/3ML SOPN INJECT UNDER THE SKIN 1.8MG DAILY 27 mL 3  . ciprofloxacin (CIPRO) 500 MG tablet Take 1 tablet (500 mg total) by mouth 2 (two) times daily. 14 tablet 6  . doxycycline (VIBRA-TABS) 100 MG tablet Take 1 tablet (100 mg total) by  mouth 2 (two) times daily. 14 tablet 0   No facility-administered medications prior to visit.    Allergies  Allergen Reactions  . Morphine Other (See Comments)    Confusion and not cooperative  . Lyrica [Pregabalin] Other (See Comments)    Personality changes  . Neurontin [Gabapentin] Other (See Comments)    Personality changes  . Other Swelling and Itching  . Atorvastatin Other (See Comments)  . Carbamazepine     REACTION: irritability  . Invokana [Canagliflozin] Other (See Comments)    Back Pain  . Metformin And Related     Renal function  . Nortriptyline Hcl     REACTION: confusion  . Oxycodone Hcl     confusion  . Neomycin-Polymyxin-Dexameth Hives    ROS Review of Systems    Objective:    Physical Exam Constitutional:      Appearance: He is well-developed and well-nourished.  HENT:     Head: Normocephalic and atraumatic.  Cardiovascular:     Rate and Rhythm: Normal rate and regular rhythm.     Heart sounds: Normal heart sounds.  Pulmonary:     Effort: Pulmonary effort is normal.     Breath sounds: Normal breath sounds.  Skin:    General: Skin is warm and dry.  Neurological:     Mental Status: He is alert and oriented to person, place, and time.  Psychiatric:        Mood and Affect: Mood and affect normal.        Behavior: Behavior normal.     BP (!) 116/59   Pulse 88   Ht 6' 2"  (1.88 m)   Wt (!) 306 lb (138.8 kg)   SpO2 97%   BMI 39.29 kg/m  Wt Readings from Last 3 Encounters:  12/19/20 (!) 306 lb (138.8 kg)  09/17/20 (!) 307 lb (139.3 kg)  05/15/20 (!) 304 lb (137.9 kg)     Health Maintenance Due  Topic Date Due  . COLONOSCOPY (Pts 45-33yr Insurance coverage will need to be confirmed)  08/22/2018  . OPHTHALMOLOGY EXAM  09/08/2020    There are no preventive care reminders to display for this patient.  Lab Results  Component Value Date   TSH 3.77 12/29/2018   Lab Results  Component Value Date   WBC 5.6 09/19/2020   HGB 15.3  09/19/2020   HCT 47.0 09/19/2020   MCV 97.9 09/19/2020   PLT 204 09/19/2020  Lab Results  Component Value Date   NA 139 09/19/2020   K 4.8 09/19/2020   CHLORIDE 103 11/17/2014   CO2 29 09/19/2020   GLUCOSE 195 (H) 09/19/2020   BUN 25 09/19/2020   CREATININE 1.39 (H) 09/19/2020   BILITOT 0.6 09/19/2020   ALKPHOS 73 04/21/2017   AST 20 09/19/2020   ALT 17 09/19/2020   PROT 7.1 09/19/2020   ALBUMIN 4.0 04/21/2017   CALCIUM 9.5 09/19/2020   ANIONGAP 11 11/17/2014   EGFR 51 (L) 11/17/2014   Lab Results  Component Value Date   CHOL 160 09/19/2020   Lab Results  Component Value Date   HDL 38 (L) 09/19/2020   Lab Results  Component Value Date   LDLCALC 98 09/19/2020   Lab Results  Component Value Date   TRIG 147 09/19/2020   Lab Results  Component Value Date   CHOLHDL 4.2 09/19/2020   Lab Results  Component Value Date   HGBA1C 7.5 (A) 12/19/2020      Assessment & Plan:   Problem List Items Addressed This Visit      Cardiovascular and Mediastinum   HYPERTENSION, BENIGN SYSTEMIC    Well controlled. Continue current regimen. Follow up in  6 mo        Endocrine   Type 2 diabetes mellitus with neurological complications ([place]) - Primary    Uncontrolled.  Hemoglobin A1c went up to 7.5.  We discussed options.  Really work on cutting back on bread and starting to get back into his walking routine I think it makes a big difference.  He usually tries to walk between two thirds to a mile per day.  So just encouraged him to get back into that.. Continue current regimen. Follow up in  3 months.  In addition I would like to try switching his Victoza to Ozempic.  He just filled a 90-day supply about a month ago so just encouraged him to finish that up and then we can try switching to Ozempic.  I will see him back in 3 months.      Relevant Medications   Semaglutide,0.25 or 0.5MG/DOS, (OZEMPIC, 0.25 OR 0.5 MG/DOSE,) 2 MG/1.5ML SOPN   Other Relevant Orders   POCT  glycosylated hemoglobin (Hb A1C) (Completed)     Other   HYPERCHOLESTEROLEMIA    LDL under 100.  Continue statin.         Meds ordered this encounter  Medications  . Semaglutide,0.25 or 0.5MG/DOS, (OZEMPIC, 0.25 OR 0.5 MG/DOSE,) 2 MG/1.5ML SOPN    Sig: Inject 0.25 mg into the skin once a week. Ok to increase to 0.34m after 1 month.    Dispense:  3 mL    Refill:  2    Pt will call when needed. This will take the place of the Victoza.    Follow-up: Return in about 4 months (around 04/18/2021) for Diabetes follow-up.    CBeatrice Lecher MD

## 2020-12-19 NOTE — Assessment & Plan Note (Addendum)
LDL under 100.  Continue statin.

## 2021-02-26 ENCOUNTER — Encounter: Payer: Self-pay | Admitting: Family Medicine

## 2021-02-26 DIAGNOSIS — R21 Rash and other nonspecific skin eruption: Secondary | ICD-10-CM

## 2021-02-26 MED ORDER — CLOBETASOL PROPIONATE 0.05 % EX OINT: 1.0000 "application " | TOPICAL_OINTMENT | Freq: Two times a day (BID) | CUTANEOUS | 0 refills | Status: DC

## 2021-03-05 ENCOUNTER — Ambulatory Visit: Payer: PPO | Admitting: Family Medicine

## 2021-03-06 ENCOUNTER — Encounter: Payer: Self-pay | Admitting: Family Medicine

## 2021-03-06 ENCOUNTER — Other Ambulatory Visit: Payer: Self-pay

## 2021-03-06 ENCOUNTER — Ambulatory Visit (INDEPENDENT_AMBULATORY_CARE_PROVIDER_SITE_OTHER): Payer: PPO | Admitting: Family Medicine

## 2021-03-06 DIAGNOSIS — F419 Anxiety disorder, unspecified: Secondary | ICD-10-CM | POA: Insufficient documentation

## 2021-03-06 DIAGNOSIS — I872 Venous insufficiency (chronic) (peripheral): Secondary | ICD-10-CM

## 2021-03-06 DIAGNOSIS — L97221 Non-pressure chronic ulcer of left calf limited to breakdown of skin: Secondary | ICD-10-CM

## 2021-03-06 DIAGNOSIS — L97909 Non-pressure chronic ulcer of unspecified part of unspecified lower leg with unspecified severity: Secondary | ICD-10-CM | POA: Insufficient documentation

## 2021-03-06 DIAGNOSIS — I83009 Varicose veins of unspecified lower extremity with ulcer of unspecified site: Secondary | ICD-10-CM | POA: Insufficient documentation

## 2021-03-06 MED ORDER — DOXYCYCLINE HYCLATE 100 MG PO TABS: 100.0000 mg | ORAL_TABLET | Freq: Two times a day (BID) | ORAL | 0 refills | Status: DC

## 2021-03-06 NOTE — Progress Notes (Signed)
Brian Strickland - 75 y.o. male MRN [id number]  Date of birth: 01/16/1946  Subjective Chief Complaint  Patient presents with  . Leg Injury    HPI RUAIRI STUTSMAN is a 75 y.o. male here today with complaint of rash and ulceration of leg.  He first noticed what he describes as a patch of dry skin to L.  Covered with bandage but this seem to irritate further and he had blister appear that eventually ulcerated.  Now with increased redness and drainage around area.  He has some swelling of legs at baseline, slightly worse currently.  He does not have good feeling in his legs due to neuropathy.  He denies fever or chills.   ROS:  A comprehensive ROS was completed and negative except as noted per HPI  Allergies  Allergen Reactions  . Morphine Other (See Comments)    Confusion and not cooperative  . Lyrica [Pregabalin] Other (See Comments)    Personality changes  . Neurontin [Gabapentin] Other (See Comments)    Personality changes  . Other Swelling and Itching  . Atorvastatin Other (See Comments)  . Carbamazepine     REACTION: irritability  . Invokana [Canagliflozin] Other (See Comments)    Back Pain  . Metformin And Related     Renal function  . Nortriptyline Hcl     REACTION: confusion  . Oxycodone Hcl     confusion  . Latex Dermatitis  . Neomycin-Polymyxin-Dexameth Hives    Past Medical History:  Diagnosis Date  . CAD (coronary artery disease)   . colon ca dx'd 3729/ 2006   colon/recurrent  . Diabetes mellitus    Type 2  . Hypertension   . 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 Urethral meatal stenosis    S/P dilatation (Dr. Geroge Baseman)    Past Surgical History:  Procedure Laterality Date  . cholectomy    . colon cancer  [phone]   recurrence  . CORONARY ARTERY BYPASS GRAFT    . REFRACTIVE SURGERY      Social History   Socioeconomic History  . Marital status: Married    Spouse name: Not on file  . Number of children: Not on file  .  Years of education: Not on file  . Highest education level: Not on file  Occupational History  . Not on file  Tobacco Use  . Smoking status: Never Smoker  . Smokeless tobacco: Never Used  Substance and Sexual Activity  . Alcohol use: Yes    Comment: Socially  . Drug use: No  . 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  Other Topics Concern  . Not on file  Social History Narrative  . Not on file   Social Determinants of Health   Financial Resource Strain: Not on file  Food Insecurity: Not on file  Transportation Needs: Not on file  Physical Activity: Not on file  Stress: Not on file  Social Connections: Not on file    Family History  Problem Relation Age of Onset  . Cancer Other        colon/family history    Health Maintenance  Topic Date Due  . COLONOSCOPY (Pts 45-21yrs Insurance coverage will need to be confirmed)  08/22/2018  . OPHTHALMOLOGY EXAM  09/08/2020  . COVID-19 Vaccine (4 - Booster for Pfizer series) 02/04/2021  . FOOT  EXAM  02/13/2021  . INFLUENZA VACCINE  06/10/2021  . HEMOGLOBIN A1C  06/18/2021  . TETANUS/TDAP  10/26/2030  . Hepatitis C Screening  Completed  . PNA vac Low Risk Adult  Completed  . HPV VACCINES  Aged Out     ----------------------------------------------------------------------------------------------------------------------------------------------------------------------------------------------------------------- Physical Exam BP (!) 182/71 (BP Location: Left Arm, Patient Position: Sitting, Cuff Size: Large)   Pulse 75   Temp 98.6 F (37 C) (Oral)   Wt (!) 305 lb (138.3 kg)   SpO2 98%   BMI 39.16 kg/m   Physical Exam Constitutional:      Appearance: Normal appearance.  HENT:     Head: Normocephalic and atraumatic.  Skin:    Comments: L leg edematous with ulceration of L calf.  There is purulent drainage  with surrounding erythema and warmth.  Diminished sensation throughout the leg and foot.   Pulses are diminished but palpable.    Neurological:     General: No focal deficit present.     Mental Status: He is alert.  Psychiatric:        Mood and Affect: Mood normal.        Behavior: Behavior normal.     ------------------------------------------------------------------------------------------------------------------------------------------------------------------------------------------------------------------- Assessment and Plan  Venous stasis ulcer ([place]) Exudative wound dressed with duoderm cgf and wrapped with kerlix and ace wrap to help with edema.  There appears to be some associated cellulitis and I will start him on a course of doxycycline 100mg  bid.  Follow up in 5 days or sooner if having worsening symptoms.    Meds ordered this encounter  Medications  . doxycycline (VIBRA-TABS) 100 MG tablet    Sig: Take 1 tablet (100 mg total) by mouth 2 (two) times daily for 10 days.    Dispense:  20 tablet    Refill:  0    Return in about 5 days (around 03/11/2021) for Wound check.    This visit occurred during the SARS-CoV-2 public health emergency.  Safety protocols were in place, including screening questions prior to the visit, additional usage of staff PPE, and extensive cleaning of exam room while observing appropriate contact time as indicated for disinfecting solutions.

## 2021-03-06 NOTE — Patient Instructions (Addendum)
Start doxycycline 100mg  twice per day.  Keep dressing dry.  Follow up in 5 days  Call if developing fever, increased pain, increased malaise/fatigue

## 2021-03-06 NOTE — Assessment & Plan Note (Signed)
Exudative wound dressed with duoderm cgf and wrapped with kerlix and ace wrap to help with edema.  There appears to be some associated cellulitis and I will start him on a course of doxycycline 100mg  bid.  Follow up in 5 days or sooner if having worsening symptoms.

## 2021-03-11 ENCOUNTER — Encounter: Payer: Self-pay | Admitting: Family Medicine

## 2021-03-11 ENCOUNTER — Ambulatory Visit (INDEPENDENT_AMBULATORY_CARE_PROVIDER_SITE_OTHER): Payer: PPO | Admitting: Family Medicine

## 2021-03-11 ENCOUNTER — Other Ambulatory Visit: Payer: Self-pay

## 2021-03-11 DIAGNOSIS — I872 Venous insufficiency (chronic) (peripheral): Secondary | ICD-10-CM | POA: Diagnosis not present

## 2021-03-11 DIAGNOSIS — L97221 Non-pressure chronic ulcer of left calf limited to breakdown of skin: Secondary | ICD-10-CM | POA: Diagnosis not present

## 2021-03-11 MED ORDER — FUROSEMIDE 20 MG PO TABS: 20.0000 mg | ORAL_TABLET | Freq: Two times a day (BID) | ORAL | 0 refills | Status: DC

## 2021-03-11 NOTE — Progress Notes (Signed)
Brian Strickland - 75 y.o. male MRN [id number]  Date of birth: January 23, 1946  Subjective Chief Complaint  Patient presents with  . Rash    HPI Brian Strickland is here today for follow up of ulceration of L calf.  He had ulceration with exudate last week.  Duoderm CGF applied last week and wrapped with ace wrap for compression.  He was also started on doxycycline as he has some surrounding erythema.  Reports that drainage has decreased some.  No increase in pain or new symptoms since last week.  Denies fever/ chills.  Continues to have some leg swelling. No respiratory symptoms.   ROS:  A comprehensive ROS was completed and negative except as noted per HPI  Allergies  Allergen Reactions  . Morphine Other (See Comments)    Confusion and not cooperative  . Lyrica [Pregabalin] Other (See Comments)    Personality changes  . Neurontin [Gabapentin] Other (See Comments)    Personality changes  . Other Swelling and Itching  . Atorvastatin Other (See Comments)  . Carbamazepine     REACTION: irritability  . Invokana [Canagliflozin] Other (See Comments)    Back Pain  . Metformin And Related     Renal function  . Nortriptyline Hcl     REACTION: confusion  . Oxycodone Hcl     confusion  . Latex Dermatitis  . Neomycin-Polymyxin-Dexameth Hives    Past Medical History:  Diagnosis Date  . CAD (coronary artery disease)   . colon ca dx'd 6269/ 2006   colon/recurrent  . Diabetes mellitus    Type 2  . Hypertension   . 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 Urethral meatal stenosis    S/P dilatation (Dr. Geroge Baseman)    Past Surgical History:  Procedure Laterality Date  . cholectomy    . colon cancer  [phone]   recurrence  . CORONARY ARTERY BYPASS GRAFT    . REFRACTIVE SURGERY      Social History   Socioeconomic History  . Marital status: Married    Spouse name: Not on file  . Number of children: Not on file  . Years of education: Not on file  . Highest  education level: Not on file  Occupational History  . Not on file  Tobacco Use  . Smoking status: Never Smoker  . Smokeless tobacco: Never Used  Substance and Sexual Activity  . Alcohol use: Yes    Comment: Socially  . Drug use: No  . 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  Other Topics Concern  . Not on file  Social History Narrative  . Not on file   Social Determinants of Health   Financial Resource Strain: Not on file  Food Insecurity: Not on file  Transportation Needs: Not on file  Physical Activity: Not on file  Stress: Not on file  Social Connections: Not on file    Family History  Problem Relation Age of Onset  . Cancer Other        colon/family history    Health Maintenance  Topic Date Due  . COLONOSCOPY (Pts 45-27yrs Insurance coverage will need to be confirmed)  08/22/2018  . OPHTHALMOLOGY EXAM  09/08/2020  . COVID-19 Vaccine (4 - Booster for Pfizer series) 02/04/2021  . FOOT EXAM  02/13/2021  . INFLUENZA VACCINE  06/10/2021  . HEMOGLOBIN  A1C  06/18/2021  . TETANUS/TDAP  10/26/2030  . Hepatitis C Screening  Completed  . PNA vac Low Risk Adult  Completed  . HPV VACCINES  Aged Out     ----------------------------------------------------------------------------------------------------------------------------------------------------------------------------------------------------------------- Physical Exam BP 131/66 (BP Location: Left Arm, Patient Position: Sitting, Cuff Size: Large)   Pulse 75   Ht 6\' 2"  (1.88 m)   Wt (!) 302 lb (137 kg)   SpO2 96%   BMI 38.77 kg/m   Physical Exam Constitutional:      Appearance: Normal appearance.  Cardiovascular:     Rate and Rhythm: Normal rate and regular rhythm.  Skin:    Comments: Ulceration to calf of LLE.  Some interval healing since last visit. Decreased erythema.   Pulses diminished  but palpable.   Neurological:     Mental Status: He is alert.     ------------------------------------------------------------------------------------------------------------------------------------------------------------------------------------------------------------------- Assessment and Plan  Venous stasis ulcer ([place]) Still with some continued exudate.  Will re-apply duoderm CGF and wrap with ace wrap for compression.  If continuing to heal and with decreased exudate I think we can consider change to unna boot for continued healing.  Short course of furosemide added.   He will complete course of doxycycline.  He has appt with Dr. Madilyn Fireman next week and have wound followed up at this appt.    Meds ordered this encounter  Medications  . furosemide (LASIX) 20 MG tablet    Sig: Take 1 tablet (20 mg total) by mouth 2 (two) times daily for 7 days.    Dispense:  14 tablet    Refill:  0    No follow-ups on file.    This visit occurred during the SARS-CoV-2 public health emergency.  Safety protocols were in place, including screening questions prior to the visit, additional usage of staff PPE, and extensive cleaning of exam room while observing appropriate contact time as indicated for disinfecting solutions.

## 2021-03-11 NOTE — Assessment & Plan Note (Addendum)
Still with some continued exudate.  Will re-apply duoderm CGF and wrap with ace wrap for compression.  If continuing to heal and with decreased exudate I think we can consider change to unna boot for continued healing.  Short course of furosemide added.   He will complete course of doxycycline.  He has appt with Dr. Madilyn Fireman next week and have wound followed up at this appt.

## 2021-03-11 NOTE — Patient Instructions (Addendum)
Keep appt with Dr. Madilyn Fireman next week.  Take lasix 20mg  twice per day for the next week.  You can adjust ace wrap as needed.  Complete antibiotic.

## 2021-03-18 ENCOUNTER — Encounter: Payer: Self-pay | Admitting: Family Medicine

## 2021-03-18 ENCOUNTER — Ambulatory Visit (INDEPENDENT_AMBULATORY_CARE_PROVIDER_SITE_OTHER): Payer: PPO | Admitting: Family Medicine

## 2021-03-18 ENCOUNTER — Other Ambulatory Visit: Payer: Self-pay

## 2021-03-18 VITALS — BP 108/39 | HR 89 | Ht 74.0 in | Wt 299.0 lb

## 2021-03-18 DIAGNOSIS — I10 Essential (primary) hypertension: Secondary | ICD-10-CM | POA: Diagnosis not present

## 2021-03-18 DIAGNOSIS — R972 Elevated prostate specific antigen [PSA]: Secondary | ICD-10-CM | POA: Diagnosis not present

## 2021-03-18 DIAGNOSIS — E1149 Type 2 diabetes mellitus with other diabetic neurological complication: Secondary | ICD-10-CM

## 2021-03-18 DIAGNOSIS — L97221 Non-pressure chronic ulcer of left calf limited to breakdown of skin: Secondary | ICD-10-CM | POA: Diagnosis not present

## 2021-03-18 DIAGNOSIS — I872 Venous insufficiency (chronic) (peripheral): Secondary | ICD-10-CM | POA: Diagnosis not present

## 2021-03-18 LAB — POCT GLYCOSYLATED HEMOGLOBIN (HGB A1C): Hemoglobin A1C: 7.2 % — AB (ref 4.0–5.6)

## 2021-03-18 NOTE — Assessment & Plan Note (Signed)
A1c down to 7.2, from 7.5 which is great progress.  Hopefully as we are able to increase the Ozempic he will continue to bring that A1c down less than 7 which would be fantastic.  Continue to work on healthy weight and exercise.  His weight is fairly stable.

## 2021-03-18 NOTE — Assessment & Plan Note (Signed)
Ischial blood pressure was a little bit low today.  We will recheck that before he goes.

## 2021-03-18 NOTE — Progress Notes (Signed)
Established Patient Office Visit  Subjective:  Patient ID: Brian Strickland, male    DOB: 1946/03/01  Age: 75 y.o. MRN: [id number]  CC:  Chief Complaint  Patient presents with  . Diabetes    HPI [name] presents for   Diabetes - no hypoglycemic events. No wounds or sores that are not healing well. No increased thirst or urination. Checking glucose at home. Taking medications as prescribed without any side effects.  He is still on the 0.25 mg Ozempic and in about a week to week and a half he will switch up to the 0.5 mg  Has been following with Dr. Zigmund Daniel in our office for the last couple of weeks for venous stasis ulcer of the left calf limited to breakdown of the skin.  Has been using DuoDERM on it.  He says it was actually 2 large blisters almost the size of his entire thumb.  It has gone down significantly.  He was able to keep the DuoDERM on for almost a week this time.  He did take the furosemide for about 5 days to reduce swelling and that has done well he is completed that course he is also completed his course of doxycycline.  Past Medical History:  Diagnosis Date  . CAD (coronary artery disease)   . colon ca dx'd 8592/ 2006   colon/recurrent  . Diabetes mellitus    Type 2  . Hypertension   . 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 Urethral meatal stenosis    S/P dilatation (Dr. Geroge Baseman)    Past Surgical History:  Procedure Laterality Date  . cholectomy    . colon cancer  [phone]   recurrence  . CORONARY ARTERY BYPASS GRAFT    . REFRACTIVE SURGERY      Family History  Problem Relation Age of Onset  . Cancer Other        colon/family history    Social History   Socioeconomic History  . Marital status: Married    Spouse name: Not on file  . Number of children: Not on file  . Years of education: Not on file  . Highest education level: Not on file  Occupational History  . Not on file  Tobacco Use  . Smoking status: Never  Smoker  . Smokeless tobacco: Never Used  Substance and Sexual Activity  . Alcohol use: Yes    Comment: Socially  . Drug use: No  . 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  Other Topics Concern  . Not on file  Social History Narrative  . Not on file   Social Determinants of Health   Financial Resource Strain: Not on file  Food Insecurity: Not on file  Transportation Needs: Not on file  Physical Activity: Not on file  Stress: Not on file  Social Connections: Not on file  Intimate Partner Violence: Not on file    Outpatient Medications Prior to Visit  Medication Sig Dispense Refill  . AMBULATORY NON FORMULARY MEDICATION Glucometer of patient and insurance choice.  Diagnosis: Diabetes 1 each 0  . aspirin 81 MG tablet Take 81 mg by mouth daily.      . clobetasol ointment (TEMOVATE) 8.63 % Apply 1 application topically 2 (two) times daily. Give skin a break after 2 weeks. 60 g 0  . diclofenac sodium (VOLTAREN)  1 % GEL APPLY TOPICALLY 4 (FOUR) TIMES DAILY. 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 fexofenadine (ALLEGRA) 180 MG tablet Take 180 mg by mouth as needed.    . furosemide (LASIX) 20 MG tablet Take 1 tablet (20 mg total) by mouth 2 (two) times daily for 7 days. 14 tablet 0  . insulin aspart (NOVOLOG FLEXPEN) 100 UNIT/ML injection SSI TID with Meals. 30 mL 5  . insulin detemir (LEVEMIR) 100 UNIT/ML injection Inject 0.5 mLs (50 Units total) into the skin 2 (two) times daily. 30 mL 5  . lisinopril (PRINIVIL,ZESTRIL) 20 MG tablet Take 20 mg by mouth daily.    . pravastatin (PRAVACHOL) 40 MG tablet Take 1 tablet (40 mg total) by mouth at bedtime. 90 tablet 3  . Semaglutide,0.25 or 0.5MG/DOS, (OZEMPIC, 0.25 OR 0.5 MG/DOSE,) 2 MG/1.5ML SOPN Inject 0.25 mg into the skin once a week. Ok to increase to 0.68m after 1 month. 3 mL 2  . triamcinolone ointment (KENALOG)  0.5 % Apply 1 application topically daily as needed. 45 g 0   No facility-administered medications prior to visit.    Allergies  Allergen Reactions  . Morphine Other (See Comments)    Confusion and not cooperative  . Lyrica [Pregabalin] Other (See Comments)    Personality changes  . Neurontin [Gabapentin] Other (See Comments)    Personality changes  . Other Swelling and Itching  . Atorvastatin Other (See Comments)  . Carbamazepine     REACTION: irritability  . Invokana [Canagliflozin] Other (See Comments)    Back Pain  . Metformin And Related     Renal function  . Nortriptyline Hcl     REACTION: confusion  . Oxycodone Hcl     confusion  . Latex Dermatitis  . Neomycin-Polymyxin-Dexameth Hives    ROS Review of Systems    Objective:    Physical Exam Constitutional:      Appearance: He is well-developed.  HENT:     Head: Normocephalic and atraumatic.  Cardiovascular:     Rate and Rhythm: Normal rate and regular rhythm.     Heart sounds: Normal heart sounds.  Pulmonary:     Effort: Pulmonary effort is normal.     Breath sounds: Normal breath sounds.  Musculoskeletal:     Cervical back: Normal range of motion.  Skin:    General: Skin is warm and dry.     Comments: Area on the left inner leg measures approximately 8.0 x 5.5 cm.  Right now demonstrates looks bruised where the blister itself is flattened out.  Neurological:     Mental Status: He is alert and oriented to person, place, and time.  Psychiatric:        Behavior: Behavior normal.        BP (!) 108/39   Pulse 89   Ht _0  (1.88 m)   Wt 299 lb (135.6 kg)   SpO2 99%   BMI 38.39 kg/m  Wt Readings from Last 3 Encounters:  03/18/21 299 lb (135.6 kg)  03/11/21 (!) 302 lb (137 kg)  03/06/21 (!) 305 lb (138.3 kg)     Health Maintenance Due  Topic Date Due  . COLONOSCOPY (Pts 45-42yrInsurance coverage will need to be confirmed)  08/22/2018  . OPHTHALMOLOGY EXAM  09/08/2020  . COVID-19 Vaccine  (4 - Booster for Pfizer series) 02/04/2021  . FOOT EXAM  02/13/2021    There are no preventive care reminders to display for this patient.  Lab Results  Component Value Date   TSH 3.77 12/29/2018   Lab Results  Component Value Date   WBC 5.6 09/19/2020   HGB 15.3 09/19/2020   HCT 47.0 09/19/2020   MCV 97.9 09/19/2020   PLT 204 09/19/2020   Lab Results  Component Value Date   NA 139 09/19/2020   K 4.8 09/19/2020   CHLORIDE 103 11/17/2014   CO2 29 09/19/2020   GLUCOSE 195 (H) 09/19/2020   BUN 25 09/19/2020   CREATININE 1.39 (H) 09/19/2020   BILITOT 0.6 09/19/2020   ALKPHOS 73 04/21/2017   AST 20 09/19/2020   ALT 17 09/19/2020   PROT 7.1 09/19/2020   ALBUMIN 4.0 04/21/2017   CALCIUM 9.5 09/19/2020   ANIONGAP 11 11/17/2014   EGFR 51 (L) 11/17/2014   Lab Results  Component Value Date   CHOL 160 09/19/2020   Lab Results  Component Value Date   HDL 38 (L) 09/19/2020   Lab Results  Component Value Date   LDLCALC 98 09/19/2020   Lab Results  Component Value Date   TRIG 147 09/19/2020   Lab Results  Component Value Date   CHOLHDL 4.2 09/19/2020   Lab Results  Component Value Date   HGBA1C 7.2 (A) 03/18/2021      Assessment & Plan:   Problem List Items Addressed This Visit      Cardiovascular and Mediastinum   HYPERTENSION, BENIGN SYSTEMIC    Ischial blood pressure was a little bit low today.  We will recheck that before he goes.      Relevant Orders   BASIC METABOLIC PANEL WITH GFR   PSA     Endocrine   Type 2 diabetes mellitus with neurological complications (HCC) - Primary    A1c down to 7.2, from 7.5 which is great progress.  Hopefully as we are able to increase the Ozempic he will continue to bring that A1c down less than 7 which would be fantastic.  Continue to work on healthy weight and exercise.  His weight is fairly stable.      Relevant Orders   POCT glycosylated hemoglobin (Hb A1C) (Completed)   BASIC METABOLIC PANEL WITH GFR   PSA      Musculoskeletal and Integument   Venous stasis ulcer ([place])    Other Visit Diagnoses    Elevated PSA       Relevant Orders   PSA     Venous stasis ulcer-it looks like it is healing really well.  Working up at the [place] back on and he will follow-up in 1 week with Dr. Zigmund Daniel I think at that point if he is doing well we should be able to release him.  I do not see any open wound or drainage today.  I think really just compression at this point would be beneficial but for now we will go ahead and do 1 more round of the DuoDERM.  No orders of the defined types were placed in this encounter.   Follow-up: Return in about 3 months (around 06/18/2021) for Diabetes follow-up.    Beatrice Lecher, MD

## 2021-03-19 LAB — BASIC METABOLIC PANEL WITH GFR
BUN/Creatinine Ratio: 20 (calc) (ref 6–22)
BUN: 27 mg/dL — ABNORMAL HIGH (ref 7–25)
CO2: 32 mmol/L (ref 20–32)
Calcium: 10.2 mg/dL (ref 8.6–10.3)
Chloride: 100 mmol/L (ref 98–110)
Creat: 1.38 mg/dL — ABNORMAL HIGH (ref 0.70–1.18)
GFR, Est African American: 58 mL/min/{1.73_m2} — ABNORMAL LOW (ref >=60)
GFR, Est Non African American: 50 mL/min/{1.73_m2} — ABNORMAL LOW (ref >=60)
Glucose, Bld: 116 mg/dL — ABNORMAL HIGH (ref 65–99)
Potassium: 4.5 mmol/L (ref 3.5–5.3)
Sodium: 139 mmol/L (ref 135–146)

## 2021-03-19 LAB — PSA: PSA: 7.55 ng/mL — ABNORMAL HIGH (ref <=4.00)

## 2021-03-20 ENCOUNTER — Encounter: Payer: Self-pay | Admitting: Family Medicine

## 2021-03-22 MED ORDER — OZEMPIC (0.25 OR 0.5 MG/DOSE) 2 MG/1.5ML ~~LOC~~ SOPN: 0.2500 mg | PEN_INJECTOR | SUBCUTANEOUS | 2 refills | Status: DC

## 2021-03-22 NOTE — Telephone Encounter (Signed)
rx printed today. Will be ready for Monday

## 2021-03-25 ENCOUNTER — Other Ambulatory Visit: Payer: Self-pay

## 2021-03-25 ENCOUNTER — Ambulatory Visit (INDEPENDENT_AMBULATORY_CARE_PROVIDER_SITE_OTHER): Payer: PPO | Admitting: Family Medicine

## 2021-03-25 ENCOUNTER — Encounter: Payer: Self-pay | Admitting: Family Medicine

## 2021-03-25 DIAGNOSIS — I872 Venous insufficiency (chronic) (peripheral): Secondary | ICD-10-CM

## 2021-03-25 DIAGNOSIS — L97221 Non-pressure chronic ulcer of left calf limited to breakdown of skin: Secondary | ICD-10-CM

## 2021-03-25 NOTE — Assessment & Plan Note (Signed)
Ulceration has healed well.  I think we can discontinue dressing changes at this time.  I do recommend that he add compression stockings daily to prevent re-occurrence of this.

## 2021-03-25 NOTE — Progress Notes (Signed)
RONALDO CRILLY - 75 y.o. male MRN [id number]  Date of birth: 1945/11/26  Subjective Chief Complaint  Patient presents with  . Foot Injury    HPI RODGERS LIKES is a 75 y.o. male here today for wound follow up .  We have been doing duoderm cgf dressing changes weekly.  He has done well with this and wound has been healing well.  He denies pain or fever.   ROS:  A comprehensive ROS was completed and negative except as noted per HPI  Allergies  Allergen Reactions  . Morphine Other (See Comments)    Confusion and not cooperative  . Lyrica [Pregabalin] Other (See Comments)    Personality changes  . Neurontin [Gabapentin] Other (See Comments)    Personality changes  . Other Swelling and Itching  . Atorvastatin Other (See Comments)  . Carbamazepine     REACTION: irritability  . Invokana [Canagliflozin] Other (See Comments)    Back Pain  . Metformin And Related     Renal function  . Nortriptyline Hcl     REACTION: confusion  . Oxycodone Hcl     confusion  . Latex Dermatitis  . Neomycin-Polymyxin-Dexameth Hives    Past Medical History:  Diagnosis Date  . CAD (coronary artery disease)   . colon ca dx'd 5809/ 2006   colon/recurrent  . Diabetes mellitus    Type 2  . Hypertension   . 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 Urethral meatal stenosis    S/P dilatation (Dr. Geroge Baseman)    Past Surgical History:  Procedure Laterality Date  . cholectomy    . colon cancer  [phone]   recurrence  . CORONARY ARTERY BYPASS GRAFT    . REFRACTIVE SURGERY      Social History   Socioeconomic History  . Marital status: Married    Spouse name: Not on file  . Number of children: Not on file  . Years of education: Not on file  . Highest education level: Not on file  Occupational History  . Not on file  Tobacco Use  . Smoking status: Never Smoker  . Smokeless tobacco: Never Used  Substance and Sexual Activity  . Alcohol use: Yes    Comment: Socially   . Drug use: No  . 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  Other Topics Concern  . Not on file  Social History Narrative  . Not on file   Social Determinants of Health   Financial Resource Strain: Not on file  Food Insecurity: Not on file  Transportation Needs: Not on file  Physical Activity: Not on file  Stress: Not on file  Social Connections: Not on file    Family History  Problem Relation Age of Onset  . Cancer Other        colon/family history    Health Maintenance  Topic Date Due  . COLONOSCOPY (Pts 45-69yrs Insurance coverage will need to be confirmed)  08/22/2018  . OPHTHALMOLOGY EXAM  09/08/2020  . COVID-19 Vaccine (4 - Booster for [place] series) 11/06/2020  . INFLUENZA VACCINE  06/10/2021  . HEMOGLOBIN A1C  09/18/2021  . FOOT EXAM  03/18/2022  . TETANUS/TDAP  10/26/2030  . Hepatitis C Screening  Completed  . PNA vac Low Risk Adult  Completed  . HPV VACCINES  Aged Out     -----------------------------------------------------------------------------------------------------------------------------------------------------------------------------------------------------------------  Physical Exam BP 133/70 (BP Location: Right Arm, Patient Position: Sitting, Cuff Size: Large)   Pulse 81   Ht 6\' 2"  (1.88 m)   Wt (!) 301 lb (136.5 kg)   SpO2 97%   BMI 38.65 kg/m   Physical Exam Constitutional:      Appearance: Normal appearance.  HENT:     Head: Normocephalic and atraumatic.  Skin:    Comments: Edema of extremities improved.  Ulceration of left leg healed well.   Neurological:     Mental Status: He is alert.  Psychiatric:        Behavior: Behavior normal.      ------------------------------------------------------------------------------------------------------------------------------------------------------------------------------------------------------------------- Assessment and Plan  Venous stasis ulcer (HCC) Ulceration has healed well.  I think we can discontinue dressing changes at this time.  I do recommend that he add compression stockings daily to prevent re-occurrence of this.     No orders of the defined types were placed in this encounter.   No follow-ups on file.    This visit occurred during the SARS-CoV-2 public health emergency.  Safety protocols were in place, including screening questions prior to the visit, additional usage of staff PPE, and extensive cleaning of exam room while observing appropriate contact time as indicated for disinfecting solutions.

## 2021-03-25 NOTE — Patient Instructions (Signed)
Start using compression socks daily.  Keep legs elevated at rest.  Let us know if symptoms return.    Chronic Venous Insufficiency Chronic venous insufficiency is a condition where the leg veins cannot effectively pump blood from the legs to the heart. This happens when the vein walls are either stretched, weakened, or damaged, or when the valves inside the vein are damaged. With the right treatment, you should be able to continue with an active life. This condition is also called venous stasis. What are the causes? Common causes of this condition include:  High blood pressure inside the veins (venous hypertension).  Sitting or standing too long, causing increased blood pressure in the leg veins.  A blood clot that blocks blood flow in a vein (deep vein thrombosis, DVT).  Inflammation of a vein (phlebitis) that causes a blood clot to form.  Tumors in the pelvis that cause blood to back up. What increases the risk? The following factors may make you more likely to develop this condition:  Having a family history of this condition.  Obesity.  Pregnancy.  Living without enough regular physical activity or exercise (sedentary lifestyle).  Smoking.  Having a job that requires long periods of standing or sitting in one place.  Being a certain age. Women in their 69s and 31s and men in their 42s are more likely to develop this condition. What are the signs or symptoms? Symptoms of this condition include:  Veins that are enlarged, bulging, or twisted (varicose veins).  Skin breakdown or ulcers.  Reddened skin or dark discoloration of skin on the leg between the knee and ankle.  Brown, smooth, tight, and painful skin just above the ankle, usually on the inside of the leg (lipodermatosclerosis).  Swelling of the legs. How is this diagnosed? This condition may be diagnosed based on:  Your medical history.  A physical exam.  Tests, such as: ? A procedure that creates an  image of a blood vessel and nearby organs and provides information about blood flow through the blood vessel (duplex ultrasound). ? A procedure that tests blood flow (plethysmography). ? A procedure that looks at the veins using X-ray and dye (venogram). How is this treated? The goals of treatment are to help you return to an active life and to minimize pain or disability. Treatment depends on the severity of your condition, and it may include:  Wearing compression stockings. These can help relieve symptoms and help prevent your condition from getting worse. However, they do not cure the condition.  Sclerotherapy. This procedure involves an injection of a solution that shrinks damaged veins.  Surgery. This may involve: ? Removing a diseased vein (vein stripping). ? Cutting off blood flow through the vein (laser ablation surgery). ? Repairing or reconstructing a valve within the affected vein.   Follow these instructions at home:  Wear compression stockings as told by your health care provider. These stockings help to prevent blood clots and reduce swelling in your legs.  Take over-the-counter and prescription medicines only as told by your health care provider.  Stay active by exercising, walking, or doing different activities. Ask your health care provider what activities are safe for you and how much exercise you need.  Drink enough fluid to keep your urine pale yellow.  Do not use any products that contain nicotine or tobacco, such as cigarettes, e-cigarettes, and chewing tobacco. If you need help quitting, ask your health care provider.  Keep all follow-up visits as told by your health  care provider. This is important.      Contact a health care provider if you:  Have redness, swelling, or more pain in the affected area.  See a red streak or line that goes up or down from the affected area.  Have skin breakdown or skin loss in the affected area, even if the breakdown is  small.  Get an injury in the affected area. Get help right away if:  You get an injury and an open wound in the affected area.  You have: ? Severe pain that does not get better with medicine. ? Sudden numbness or weakness in the foot or ankle below the affected area. ? Trouble moving your foot or ankle. ? A fever. ? Worse or persistent symptoms. ? Chest pain. ? Shortness of breath. Summary  Chronic venous insufficiency is a condition where the leg veins cannot effectively pump blood from the legs to the heart.  Chronic venous insufficiency occurs when the vein walls become stretched, weakened, or damaged, or when valves within the vein are damaged.  Treatment depends on how severe your condition is. It often involves wearing compression stockings and may involve having a procedure.  Make sure you stay active by exercising, walking, or doing different activities. Ask your health care provider what activities are safe for you and how much exercise you need. This information is not intended to replace advice given to you by your health care provider. Make sure you discuss any questions you have with your health care provider. Document Revised: 07/20/2018 Document Reviewed: 07/20/2018 Elsevier Patient Education  2021 [employer].

## 2021-05-15 ENCOUNTER — Encounter: Payer: Self-pay | Admitting: Family Medicine

## 2021-05-15 MED ORDER — SEMAGLUTIDE (1 MG/DOSE) 4 MG/3ML ~~LOC~~ SOPN: 1.0000 mg | PEN_INJECTOR | SUBCUTANEOUS | 1 refills | Status: DC

## 2021-05-16 NOTE — Telephone Encounter (Signed)
Meds ordered this encounter  Medications   Semaglutide, 1 MG/DOSE, 4 MG/3ML SOPN    Sig: Inject 1 mg as directed once a week.    Dispense:  3 mL    Refill:  1

## 2021-06-05 ENCOUNTER — Telehealth: Payer: Self-pay | Admitting: Family Medicine

## 2021-06-05 NOTE — Chronic Care Management (AMB) (Signed)
  Chronic Care Management   Note  06/05/2021 Name: Brian Strickland MRN: [id number] DOB: 11/09/1946  Brian Strickland is a 75 y.o. year old male who is a primary care patient of Metheney, Rene Kocher, MD. I reached out to Brian Strickland by phone today in response to a referral sent by Brian Strickland PCP, Hali Marry, MD.   Brian Strickland was given information about Chronic Care Management services today including:  CCM service includes personalized support from designated clinical staff supervised by his physician, including individualized plan of care and coordination with other care providers 24/7 contact phone numbers for assistance for urgent and routine care needs. Service will only be billed when office clinical staff spend 20 minutes or more in a month to coordinate care. Only one practitioner may furnish and bill the service in a calendar month. The patient may stop CCM services at any time (effective at the end of the month) by phone call to the office staff.   Patient agreed to services and verbal consent obtained.   Follow up plan:   Brian Strickland Upstream Scheduler

## 2021-06-20 ENCOUNTER — Telehealth: Payer: Self-pay | Admitting: *Deleted

## 2021-06-20 DIAGNOSIS — E1149 Type 2 diabetes mellitus with other diabetic neurological complication: Secondary | ICD-10-CM

## 2021-06-20 MED ORDER — VICTOZA 18 MG/3ML ~~LOC~~ SOPN: PEN_INJECTOR | SUBCUTANEOUS | 1 refills | Status: DC

## 2021-06-20 NOTE — Telephone Encounter (Signed)
Pt called and stated that his pharmacy informed him of a nationwide shortage of the [place].   He asked if he could get a refill of the Victoza.   Refill sent to [facility].

## 2021-06-25 ENCOUNTER — Telehealth: Payer: Self-pay | Admitting: Lab

## 2021-06-27 ENCOUNTER — Ambulatory Visit: Payer: PPO | Admitting: Family Medicine

## 2021-07-02 NOTE — Chronic Care Management (AMB) (Signed)
error 

## 2021-07-06 ENCOUNTER — Other Ambulatory Visit: Payer: Self-pay | Admitting: Family Medicine

## 2021-07-09 ENCOUNTER — Ambulatory Visit (INDEPENDENT_AMBULATORY_CARE_PROVIDER_SITE_OTHER): Payer: PPO | Admitting: Family Medicine

## 2021-07-09 ENCOUNTER — Other Ambulatory Visit: Payer: Self-pay

## 2021-07-09 ENCOUNTER — Encounter: Payer: Self-pay | Admitting: Family Medicine

## 2021-07-09 VITALS — BP 109/53 | HR 79 | Ht 74.0 in | Wt 297.0 lb

## 2021-07-09 DIAGNOSIS — E1149 Type 2 diabetes mellitus with other diabetic neurological complication: Secondary | ICD-10-CM

## 2021-07-09 DIAGNOSIS — I10 Essential (primary) hypertension: Secondary | ICD-10-CM

## 2021-07-09 DIAGNOSIS — R432 Parageusia: Secondary | ICD-10-CM | POA: Diagnosis not present

## 2021-07-09 DIAGNOSIS — Z23 Encounter for immunization: Secondary | ICD-10-CM | POA: Diagnosis not present

## 2021-07-09 LAB — POCT GLYCOSYLATED HEMOGLOBIN (HGB A1C): Hemoglobin A1C: 6.8 % — AB (ref 4.0–5.6)

## 2021-07-09 NOTE — Assessment & Plan Note (Signed)
A1c looks phenomenal today at 6.8 he is made some great progress just encouraged him to continue work on healthy diet and regular exercise.  Back on the Victoza for now.  He is doing 40 units of Levemir daily and does use his NovoLog with breakfast and his evening meal.  He has been able to decrease his evening meal dose recently.

## 2021-07-09 NOTE — Progress Notes (Signed)
Established Patient Office Visit  Subjective:  Patient ID: Brian Strickland, male    DOB: [birth year]  Age: 75 y.o. MRN: [id number]  CC:  Chief Complaint  Patient presents with   Diabetes    HPI Brian Strickland presents for   Diabetes - no hypoglycemic events. No wounds or sores that are not healing well. No increased thirst or urination. Checking glucose at home. Taking medications as prescribed without any side effects.  He is currently on Victoza.  We had switched him to Ozempic he got up to the 1 mg dose and actually did well with that.  But unfortunately was on national backorder so we had to switch back to the Victoza he started about 2 weeks ago he is back up to the 1.8 mg dose and doing well.  Hypertension- Pt denies chest pain, SOB, dizziness, or heart palpitations.  Taking meds as directed w/o problems.  Denies medication side effects.    Since last [place] booster has had taste disturbance.     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 Geroge Baseman)    Past Surgical History:  Procedure Laterality Date   cholectomy     colon cancer  09-1997   recurrence   CORONARY ARTERY BYPASS GRAFT     REFRACTIVE SURGERY      Family History  Problem Relation Age of Onset   Cancer Other        colon/family history    Social History   Socioeconomic History   Marital status: Married    Spouse name: Not on file   Number of children: Not on file   Years of education: Not on file   Highest education level: Not on file  Occupational History   Not on file  Tobacco Use   Smoking status: Never   Smokeless tobacco: Never  Substance and Sexual Activity   Alcohol use: Yes    Comment: Socially   Drug use: No   Sexual activity: Not on file     Comment: diable for neuropathy, 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  Other Topics Concern   Not on file  Social History Narrative   Not on file   Social Determinants of Health   Financial Resource Strain: Not on file  Food Insecurity: Not on file  Transportation Needs: Not on file  Physical Activity: Not on file  Stress: Not on file  Social Connections: Not on file  Intimate Partner Violence: Not on file    Outpatient Medications Prior to Visit  Medication Sig Dispense Refill   AMBULATORY NON [place] of patient and insurance choice.  Diagnosis: Diabetes 1 each 0   aspirin 81 MG tablet Take 81 mg by mouth daily.       clobetasol ointment (TEMOVATE) 2.02 % Apply 1 application topically 2 (two) times daily. Give skin a break after 2 weeks. 60 g 0   diclofenac sodium (VOLTAREN) 1 % GEL APPLY TOPICALLY 4 (FOUR) TIMES DAILY. 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  insulin aspart (NOVOLOG FLEXPEN) 100 UNIT/ML injection SSI TID with Meals. 30 mL 5   insulin detemir (LEVEMIR) 100 UNIT/ML injection Inject 0.5 mLs (50 Units total) into the skin 2 (two) times daily. 30 mL 5   liraglutide (VICTOZA) 18 MG/3ML SOPN DIAL AND INJECT UNDER THE SKIN 1.8 MG DAILY 18 mL 1   lisinopril (PRINIVIL,ZESTRIL) 20 MG tablet Take 20 mg by mouth daily.     pravastatin (PRAVACHOL) 40 MG tablet Take 1 tablet (40 mg total) by mouth at bedtime. 90 tablet 3   triamcinolone ointment (KENALOG) 0.5 % Apply 1 application topically daily as needed. 45 g 0   furosemide (LASIX) 20 MG tablet Take 1 tablet (20 mg total) by mouth 2 (two) times daily for 7 days. 14 tablet 0   OZEMPIC, 1 MG/DOSE, 4 MG/3ML SOPN Inject 1 mg as directed once a week. 3 mL 1   No facility-administered medications prior to visit.    Allergies  Allergen Reactions   Morphine Other (See  Comments)    Confusion and not cooperative   Lyrica [Pregabalin] Other (See Comments)    Personality changes   Neurontin [Gabapentin] Other (See Comments)    Personality changes   Other Swelling and Itching   Atorvastatin Other (See Comments)   Carbamazepine     REACTION: irritability   Invokana [Canagliflozin] Other (See Comments)    Back Pain   Metformin And Related     Renal function   Nortriptyline Hcl     REACTION: confusion   Oxycodone Hcl     confusion   Latex Dermatitis   Neomycin-Polymyxin-Dexameth Hives    ROS Review of Systems    Objective:    Physical Exam Constitutional:      Appearance: Normal appearance. He is well-developed.  HENT:     Head: Normocephalic and atraumatic.  Cardiovascular:     Rate and Rhythm: Normal rate and regular rhythm.     Heart sounds: Normal heart sounds.  Pulmonary:     Effort: Pulmonary effort is normal.     Breath sounds: Normal breath sounds.  Skin:    General: Skin is warm and dry.  Neurological:     Mental Status: He is alert and oriented to person, place, and time. Mental status is at baseline.  Psychiatric:        Behavior: Behavior normal.    BP (!) 109/53   Pulse 79   Ht 6' 2"  (1.88 m)   Wt 297 lb 0.6 oz (134.7 kg)   SpO2 97%   BMI 38.14 kg/m  Wt Readings from Last 3 Encounters:  07/09/21 297 lb 0.6 oz (134.7 kg)  03/25/21 (!) 301 lb (136.5 kg)  03/18/21 299 lb (135.6 kg)     Health Maintenance Due  Topic Date Due   COLONOSCOPY (Pts 45-11yr Insurance coverage will need to be confirmed)  08/22/2018   OPHTHALMOLOGY EXAM  09/08/2020   COVID-19 Vaccine (5 - Booster for Pfizer series) 06/28/2021    There are no preventive care reminders to display for this patient.  Lab Results  Component Value Date   TSH 3.77 12/29/2018   Lab Results  Component Value Date   WBC 5.6 09/19/2020   HGB 15.3 09/19/2020   HCT 47.0 09/19/2020   MCV 97.9 09/19/2020   PLT 204 09/19/2020   Lab Results  Component  Value Date   NA 139 03/18/2021   K 4.5 03/18/2021   CHLORIDE 103 11/17/2014   CO2 32 03/18/2021   GLUCOSE  116 (H) 03/18/2021   BUN 27 (H) 03/18/2021   CREATININE 1.38 (H) 03/18/2021   BILITOT 0.6 09/19/2020   ALKPHOS 73 04/21/2017   AST 20 09/19/2020   ALT 17 09/19/2020   PROT 7.1 09/19/2020   ALBUMIN 4.0 04/21/2017   CALCIUM 10.2 03/18/2021   ANIONGAP 11 11/17/2014   EGFR 51 (L) 11/17/2014   Lab Results  Component Value Date   CHOL 160 09/19/2020   Lab Results  Component Value Date   HDL 38 (L) 09/19/2020   Lab Results  Component Value Date   LDLCALC 98 09/19/2020   Lab Results  Component Value Date   TRIG 147 09/19/2020   Lab Results  Component Value Date   CHOLHDL 4.2 09/19/2020   Lab Results  Component Value Date   HGBA1C 6.8 (A) 07/09/2021      Assessment & Plan:   Problem List Items Addressed This Visit       Cardiovascular and Mediastinum   HYPERTENSION, BENIGN SYSTEMIC    Well controlled. Continue current regimen. Follow up in  3 mo. Down about 4 lbs.          Endocrine   Type 2 diabetes mellitus with neurological complications (HCC) - Primary    A1c looks phenomenal today at 6.8 he is made some great progress just encouraged him to continue work on healthy diet and regular exercise.  Back on the Victoza for now.  He is doing 40 units of Levemir daily and does use his NovoLog with breakfast and his evening meal.  He has been able to decrease his evening meal dose recently.      Relevant Orders   POCT glycosylated hemoglobin (Hb A1C) (Completed)   Other Visit Diagnoses     Need for immunization against influenza       Relevant Orders   Flu Vaccine QUAD High Dose(Fluad) (Completed)   Taste sense altered           No orders of the defined types were placed in this encounter.   Follow-up: Return in about 3 months (around 10/14/2021) for Diabetes follow-up.    Beatrice Lecher, MD

## 2021-07-09 NOTE — Assessment & Plan Note (Addendum)
Well controlled. Continue current regimen. Follow up in  3 mo. Down about 4 lbs.

## 2021-07-22 ENCOUNTER — Telehealth: Payer: PPO

## 2021-08-30 ENCOUNTER — Encounter: Payer: Self-pay | Admitting: Family Medicine

## 2021-08-30 DIAGNOSIS — R21 Rash and other nonspecific skin eruption: Secondary | ICD-10-CM

## 2021-08-30 MED ORDER — CLOBETASOL PROPIONATE 0.05 % EX OINT
1.0000 | TOPICAL_OINTMENT | Freq: Two times a day (BID) | CUTANEOUS | 1 refills | Status: AC
Start: 2021-08-30 — End: ?

## 2021-10-14 ENCOUNTER — Other Ambulatory Visit: Payer: Self-pay

## 2021-10-14 ENCOUNTER — Ambulatory Visit (INDEPENDENT_AMBULATORY_CARE_PROVIDER_SITE_OTHER): Payer: PPO | Admitting: Family Medicine

## 2021-10-14 ENCOUNTER — Encounter: Payer: Self-pay | Admitting: Family Medicine

## 2021-10-14 VITALS — BP 135/76 | HR 93 | Ht 74.0 in | Wt 292.0 lb

## 2021-10-14 DIAGNOSIS — I10 Essential (primary) hypertension: Secondary | ICD-10-CM | POA: Diagnosis not present

## 2021-10-14 DIAGNOSIS — E7800 Pure hypercholesterolemia, unspecified: Secondary | ICD-10-CM

## 2021-10-14 DIAGNOSIS — E1149 Type 2 diabetes mellitus with other diabetic neurological complication: Secondary | ICD-10-CM | POA: Diagnosis not present

## 2021-10-14 LAB — POCT GLYCOSYLATED HEMOGLOBIN (HGB A1C): Hemoglobin A1C: 7 % — AB (ref 4.0–5.6)

## 2021-10-14 NOTE — Assessment & Plan Note (Signed)
Well controlled. Continue current regimen. Follow up in  3-4 mo  

## 2021-10-14 NOTE — Assessment & Plan Note (Signed)
1C went up slightly to 7.0.  Discussed that the goal is to have it under 7.  We discussed possibly increasing his Victoza to 1.8 mg but he says he wants to hold off and maybe do a better job with his diet.  Though he says that he has been working on his diet and in fact has lost about 5 pounds.  His biggest concern is that foods do not taste is good on the fact [place] and that is why does not really want to go up on the medication.

## 2021-10-14 NOTE — Assessment & Plan Note (Signed)
He had his cholesterol checked about 4 months ago at the [place] he has been tolerating his statin well without any problems.  We will call and see if and get a copy of his results and get those abstracted since it looks like on our labs he is due for repeat.

## 2021-10-14 NOTE — Progress Notes (Signed)
Established Patient Office Visit  Subjective:  Patient ID: Brian Strickland, male    DOB: October 06, 1946  Age: 75 y.o. MRN: [id number]  CC:  Chief Complaint  Patient presents with   Diabetes    HPI Brian Strickland presents for   Diabetes - no hypoglycemic events. No wounds or sores that are not healing well. No increased thirst or urination. Checking glucose at home. Taking medications as prescribed without any side effects.  Hypertension- Pt denies chest pain, SOB, dizziness, or heart palpitations.  Taking meds as directed w/o problems.  Denies medication side effects.    Hyperlipidemia - tolerating stating well with no myalgias or significant side effects.  Lab Results  Component Value Date   CHOL 160 09/19/2020   HDL 38 (L) 09/19/2020   LDLCALC 98 09/19/2020   TRIG 147 09/19/2020   CHOLHDL 4.2 09/19/2020    He is down 5 lbs.  He is trying to eat better.   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 Geroge Baseman)    Past Surgical History:  Procedure Laterality Date   cholectomy     colon cancer  09-1997   recurrence   CORONARY ARTERY BYPASS GRAFT     REFRACTIVE SURGERY      Family History  Problem Relation Age of Onset   Cancer Other        colon/family history    Social History   Socioeconomic History   Marital status: Married    Spouse name: Not on file   Number of children: Not on file   Years of education: Not on file   Highest education level: Not on file  Occupational History   Not on file  Tobacco Use   Smoking status: Never   Smokeless tobacco: Never  Substance and Sexual Activity   Alcohol use: Yes    Comment: Socially   Drug use: No   Sexual activity: Not on file    Comment: diable for neuropathy, 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  Other Topics Concern   Not on file  Social History Narrative   Not on file   Social Determinants of Health   Financial Resource Strain: Not on file  Food Insecurity: Not on file  Transportation Needs: Not on file  Physical Activity: Not on file  Stress: Not on file  Social Connections: Not on file  Intimate Partner Violence: Not on file    Outpatient Medications Prior to Visit  Medication Sig Dispense Refill   AMBULATORY NON [place] of patient and insurance choice.  Diagnosis: Diabetes 1 each 0   aspirin 81 MG tablet Take 81 mg by mouth daily.       clobetasol ointment (TEMOVATE) 8.00 % Apply 1 application topically 2 (two) times daily. Give skin a break after 2 weeks. 60 g 1   diclofenac sodium (VOLTAREN) 1 % GEL APPLY TOPICALLY 4 (FOUR) TIMES DAILY. 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     insulin aspart (NOVOLOG FLEXPEN) 100 UNIT/ML injection SSI TID with Meals. [place]  mL 5   insulin detemir (LEVEMIR) 100 UNIT/ML injection Inject 0.5 mLs (50 Units total) into the skin 2 (two) times daily. 30 mL 5   liraglutide (VICTOZA) 18 MG/3ML SOPN DIAL AND INJECT UNDER THE SKIN 1.8 MG DAILY (Patient taking differently: 1.2 mg. DIAL AND INJECT UNDER THE SKIN 1.8 MG DAILY) 18 mL 1   lisinopril (PRINIVIL,ZESTRIL) 20 MG tablet Take 20 mg by mouth daily.     pravastatin (PRAVACHOL) 40 MG tablet Take 1 tablet (40 mg total) by mouth at bedtime. 90 tablet 3   triamcinolone ointment (KENALOG) 0.5 % Apply 1 application topically daily as needed. 45 g 0   furosemide (LASIX) 20 MG tablet Take 1 tablet (20 mg total) by mouth 2 (two) times daily for 7 days. 14 tablet 0   OZEMPIC, 1 MG/DOSE, 4 MG/3ML SOPN Inject 1 mg as directed once a week. (Patient not taking: Reported on 10/14/2021) 3 mL 1   No facility-administered  medications prior to visit.    Allergies  Allergen Reactions   Morphine Other (See Comments)    Confusion and not cooperative   Lyrica [Pregabalin] Other (See Comments)    Personality changes   Neurontin [Gabapentin] Other (See Comments)    Personality changes   Other Swelling and Itching   Atorvastatin Other (See Comments)   Carbamazepine     REACTION: irritability   Invokana [Canagliflozin] Other (See Comments)    Back Pain   Metformin And Related     Renal function   Nortriptyline Hcl     REACTION: confusion   Oxycodone Hcl     confusion   Latex Dermatitis   Neomycin-Polymyxin-Dexameth Hives    ROS Review of Systems    Objective:    Physical Exam Constitutional:      Appearance: Normal appearance. He is well-developed.  HENT:     Head: Normocephalic and atraumatic.  Cardiovascular:     Rate and Rhythm: Normal rate and regular rhythm.     Heart sounds: Normal heart sounds.  Pulmonary:     Effort: Pulmonary effort is normal.     Breath sounds: Normal breath sounds.  Skin:    General: Skin is warm and dry.  Neurological:     Mental Status: He is alert and oriented to person, place, and time. Mental status is at baseline.  Psychiatric:        Behavior: Behavior normal.    BP 135/76   Pulse 93   Ht 6' 2"  (1.88 m)   Wt 292 lb (132.5 kg)   SpO2 98%   BMI 37.49 kg/m  Wt Readings from Last 3 Encounters:  10/14/21 292 lb (132.5 kg)  07/09/21 297 lb 0.6 oz (134.7 kg)  03/25/21 (!) 301 lb (136.5 kg)     Health Maintenance Due  Topic Date Due   COLONOSCOPY (Pts 45-27yr Insurance coverage will need to be confirmed)  08/22/2018   OPHTHALMOLOGY EXAM  09/08/2020   COVID-19 Vaccine (6 - Booster for Pfizer series) 06/21/2021    There are no preventive care reminders to display for this patient.  Lab Results  Component Value Date   TSH 3.77 12/29/2018   Lab Results  Component Value Date   WBC 5.6 09/19/2020   HGB 15.3 09/19/2020   HCT 47.0 09/19/2020    MCV 97.9 09/19/2020   PLT 204 09/19/2020   Lab Results  Component Value Date   NA 139 03/18/2021   K 4.5 03/18/2021   CHLORIDE 103 11/17/2014   CO2  32 03/18/2021   GLUCOSE 116 (H) 03/18/2021   BUN 27 (H) 03/18/2021   CREATININE 1.38 (H) 03/18/2021   BILITOT 0.6 09/19/2020   ALKPHOS 73 04/21/2017   AST 20 09/19/2020   ALT 17 09/19/2020   PROT 7.1 09/19/2020   ALBUMIN 4.0 04/21/2017   CALCIUM 10.2 03/18/2021   ANIONGAP 11 11/17/2014   EGFR 51 (L) 11/17/2014   Lab Results  Component Value Date   CHOL 160 09/19/2020   Lab Results  Component Value Date   HDL 38 (L) 09/19/2020   Lab Results  Component Value Date   LDLCALC 98 09/19/2020   Lab Results  Component Value Date   TRIG 147 09/19/2020   Lab Results  Component Value Date   CHOLHDL 4.2 09/19/2020   Lab Results  Component Value Date   HGBA1C 7.0 (A) 10/14/2021      Assessment & Plan:   Problem List Items Addressed This Visit       Cardiovascular and Mediastinum   HYPERTENSION, BENIGN SYSTEMIC    Well controlled. Continue current regimen. Follow up in  3-4 mo         Endocrine   Type 2 diabetes mellitus with neurological complications ([place]) - Primary    1C went up slightly to 7.0.  Discussed that the goal is to have it under 7.  We discussed possibly increasing his Victoza to 1.8 mg but he says he wants to hold off and maybe do a better job with his diet.  Though he says that he has been working on his diet and in fact has lost about 5 pounds.  His biggest concern is that foods do not taste is good on the fact [place] and that is why does not really want to go up on the medication.      Relevant Orders   POCT glycosylated hemoglobin (Hb A1C) (Completed)     Other   HYPERCHOLESTEROLEMIA    He had his cholesterol checked about 4 months ago at the [place] he has been tolerating his statin well without any problems.  We will call and see if and get a copy of his results and get those abstracted since it looks  like on our labs he is due for repeat.      Will fax to [facility] for last labs.  He reports all his immunizations are up-to-date.   No orders of the defined types were placed in this encounter.   Follow-up: Return in about 14 weeks (around 01/20/2022) for Diabetes follow-up.    Beatrice Lecher, MD

## 2021-10-31 ENCOUNTER — Other Ambulatory Visit: Payer: Self-pay | Admitting: Family Medicine

## 2021-12-02 ENCOUNTER — Other Ambulatory Visit: Payer: Self-pay | Admitting: Family Medicine

## 2021-12-02 DIAGNOSIS — E1149 Type 2 diabetes mellitus with other diabetic neurological complication: Secondary | ICD-10-CM

## 2022-01-20 ENCOUNTER — Ambulatory Visit: Payer: PPO | Admitting: Family Medicine

## 2022-02-04 ENCOUNTER — Other Ambulatory Visit: Payer: Self-pay

## 2022-02-04 ENCOUNTER — Encounter: Payer: Self-pay | Admitting: Family Medicine

## 2022-02-04 ENCOUNTER — Ambulatory Visit (INDEPENDENT_AMBULATORY_CARE_PROVIDER_SITE_OTHER): Payer: PPO | Admitting: Family Medicine

## 2022-02-04 ENCOUNTER — Telehealth: Payer: Self-pay | Admitting: Family Medicine

## 2022-02-04 VITALS — BP 139/58 | HR 90 | Resp 18 | Ht 74.0 in | Wt 290.0 lb

## 2022-02-04 DIAGNOSIS — I10 Essential (primary) hypertension: Secondary | ICD-10-CM

## 2022-02-04 DIAGNOSIS — E1149 Type 2 diabetes mellitus with other diabetic neurological complication: Secondary | ICD-10-CM | POA: Diagnosis not present

## 2022-02-04 LAB — POCT GLYCOSYLATED HEMOGLOBIN (HGB A1C): Hemoglobin A1C: 7.3 % — AB (ref 4.0–5.6)

## 2022-02-04 MED ORDER — VICTOZA 18 MG/3ML ~~LOC~~ SOPN: 1.8000 mg | PEN_INJECTOR | Freq: Every day | SUBCUTANEOUS | 3 refills | Status: DC

## 2022-02-04 NOTE — Telephone Encounter (Signed)
[facility] and get [facility] and labs.  ?

## 2022-02-04 NOTE — Progress Notes (Signed)
? ?Established Patient Office Visit ? ?Subjective:  ?Patient ID: Brian Strickland, male    DOB: 10/09/1946  Age: 76 y.o. MRN: [id number] ? ?CC:  ?Chief Complaint  ?Patient presents with  ? Diabetes  ?  Follow up  ? Colonoscopy  ?  Patient will have appointment scheduled through [place]  ? Diabetes Eye Exam  ?  Patient has eye exam scheduled in 03/2022  ? ? ?HPI ?Brian Strickland presents for  ? ?Diabetes - no hypoglycemic events. No wounds or sores that are not healing well. No increased thirst or urination. Checking glucose at home. Taking medications as prescribed without any side effects. Patient has eye exam scheduled in 03/2022.  Patient has eye exam scheduled in 03/2022. Doing well with the Victoza 1.$RemoveBefor'8mg'sIBatKmYOfVH$ . not wanting to go up on the dose. Has been able to cut back on the Levemir. Using. 36 units in AM and 34 in PM.  Says he is always less active in the winter and in the summer gets out and does a lot of yard work.  ? ?Hyperlipidemia - tolerating stating well with no myalgias or significant side effects.  ?Lab Results  ?Component Value Date  ? CHOL 160 09/19/2020  ? HDL 38 (L) 09/19/2020  ? [place] 98 09/19/2020  ? TRIG 147 09/19/2020  ? CHOLHDL 4.2 09/19/2020  ? ? ? ?Past Medical History:  ?Diagnosis Date  ? CAD (coronary artery disease)   ? colon ca dx'd 7903/ 2006  ? colon/recurrent  ? Diabetes mellitus   ? Type 2  ? Hypertension   ? Peripheral neuropathy   ? secondary to DM and chemotherapy  ? Urethral meatal stenosis   ? S/P dilatation (Dr. Geroge Baseman)  ? ? ?Past Surgical History:  ?Procedure Laterality Date  ? cholectomy    ? colon cancer  [phone]  ? recurrence  ? CORONARY ARTERY BYPASS GRAFT    ? REFRACTIVE SURGERY    ? ? ?Family History  ?Problem Relation Age of Onset  ? Cancer Other   ?     colon/family history  ? ? ?Social History  ? ?Socioeconomic History  ? Marital status: Married  ?  Spouse name: Not on file  ? Number of children: Not on file  ? Years of education: Not on file  ? Highest education level:  Not on file  ?Occupational History  ? Not on file  ?Tobacco Use  ? Smoking status: Never  ? Smokeless tobacco: Never  ?Substance and Sexual Activity  ? Alcohol use: Yes  ?  Comment: Socially  ? Drug use: No  ? Sexual activity: Not on file  ?  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  ?Other Topics Concern  ? Not on file  ?Social History Narrative  ? Not on file  ? ?Social Determinants of Health  ? ?Financial Resource Strain: Not on file  ?Food Insecurity: Not on file  ?Transportation Needs: Not on file  ?Physical Activity: Not on file  ?Stress: Not on file  ?Social Connections: Not on file  ?Intimate Partner Violence: Not on file  ? ? ?Outpatient Medications Prior to Visit  ?Medication Sig Dispense Refill  ? AMBULATORY NON FORMULARY MEDICATION Glucom[place] of patient and insurance choice.  Diagnosis: Diabetes 1 each 0  ? aspirin 81 MG tablet Take 81 mg by mouth daily.      ? ciprofloxacin (CIPRO) 500 MG tablet Take 1 tablet (500  mg total) by mouth 2 (two) times daily. 14 tablet 6  ? clobetasol ointment (TEMOVATE) 6.72 % Apply 1 application topically 2 (two) times daily. Give skin a break after 2 weeks. 60 g 1  ? diclofenac sodium (VOLTAREN) 1 % GEL APPLY TOPICALLY 4 (FOUR) TIMES DAILY. 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 fexofenadine (ALLEGRA) 180 MG tablet Take 180 mg by mouth as needed.    ? insulin aspart (NOVOLOG FLEXPEN) 100 UNIT/ML injection SSI TID with Meals. 30 mL 5  ? insulin detemir (LEVEMIR) 100 UNIT/ML injection Inject 0.5 mLs (50 Units total) into the skin 2 (two) times daily. 30 mL 5  ? lisinopril (PRINIVIL,ZESTRIL) 20 MG tablet Take 20 mg by mouth daily.    ? pravastatin (PRAVACHOL) 40 MG tablet Take 1 tablet (40 mg total) by mouth at bedtime. 90 tablet 3  ? triamcinolone ointment (KENALOG) 0.5 % Apply 1 application topically daily as needed. 45 g 0  ? liraglutide (VICTOZA) 18 MG/3ML SOPN Inject 1.2 mg into  the skin daily. (Patient taking differently: Inject 1.8 mg into the skin daily.) 18 mL 0  ? ?No facility-administered medications prior to visit.  ? ? ?Allergies  ?Allergen Reactions  ? Morphine Other (See Comments)  ?  Confusion and not cooperative  ? Lyrica [Pregabalin] Other (See Comments)  ?  Personality changes  ? Neurontin [Gabapentin] Other (See Comments)  ?  Personality changes  ? Other Swelling and Itching  ? Atorvastatin Other (See Comments)  ? Carbamazepine   ?  REACTION: irritability  ? Invokana [Canagliflozin] Other (See Comments)  ?  Back Pain  ? Metformin And Related   ?  Renal function  ? Nortriptyline Hcl   ?  REACTION: confusion  ? Oxycodone Hcl   ?  confusion  ? Latex Dermatitis  ? Neomycin-Polymyxin-Dexameth Hives  ? ? ?ROS ?Review of Systems ? ?  ?Objective:  ?  ?Physical Exam ?Constitutional:   ?   Appearance: Normal appearance. He is well-developed.  ?HENT:  ?   Head: Normocephalic and atraumatic.  ?Cardiovascular:  ?   Rate and Rhythm: Normal rate and regular rhythm.  ?   Heart sounds: Normal heart sounds.  ?Pulmonary:  ?   Effort: Pulmonary effort is normal.  ?   Breath sounds: Normal breath sounds.  ?Skin: ?   General: Skin is warm and dry.  ?Neurological:  ?   Mental Status: He is alert and oriented to person, place, and time. Mental status is at baseline.  ?Psychiatric:     ?   Behavior: Behavior normal.  ? ? ?BP (!) 139/58   Pulse 90   Resp 18   Ht $R'6\' 2"'Pj$  (1.88 m)   Wt 290 lb (131.5 kg)   SpO2 98%   BMI 37.23 kg/m?  ?Wt Readings from Last 3 Encounters:  ?02/04/22 290 lb (131.5 kg)  ?10/14/21 292 lb (132.5 kg)  ?07/09/21 297 lb 0.6 oz (134.7 kg)  ? ? ? ?Health Maintenance Due  ?Topic Date Due  ? COLONOSCOPY (Pts 45-92yrs Insurance coverage will need to be confirmed)  08/22/2018  ? OPHTHALMOLOGY EXAM  09/08/2020  ? ? ?There are no preventive care reminders to display for this patient. ? ?Lab Results  ?Component Value Date  ? TSH 3.77 12/29/2018  ? ?Lab Results  ?Component Value Date   ? WBC 5.6 09/19/2020  ? HGB 15.3 09/19/2020  ? HCT 47.0 09/19/2020  ? MCV 97.9 09/19/2020  ? PLT 204 09/19/2020  ? ?  Lab Results  ?Component Value Date  ? NA 139 03/18/2021  ? K 4.5 03/18/2021  ? CHLORIDE 103 11/17/2014  ? CO2 32 03/18/2021  ? GLUCOSE 116 (H) 03/18/2021  ? BUN 27 (H) 03/18/2021  ? CREATININE 1.38 (H) 03/18/2021  ? BILITOT 0.6 09/19/2020  ? ALKPHOS 73 04/21/2017  ? AST 20 09/19/2020  ? ALT 17 09/19/2020  ? PROT 7.1 09/19/2020  ? ALBUMIN 4.0 04/21/2017  ? CALCIUM 10.2 03/18/2021  ? ANIONGAP 11 11/17/2014  ? EGFR 51 (L) 11/17/2014  ? ?Lab Results  ?Component Value Date  ? CHOL 160 09/19/2020  ? ?Lab Results  ?Component Value Date  ? HDL 38 (L) 09/19/2020  ? ?Lab Results  ?Component Value Date  ? [place] 98 09/19/2020  ? ?Lab Results  ?Component Value Date  ? TRIG 147 09/19/2020  ? ?Lab Results  ?Component Value Date  ? CHOLHDL 4.2 09/19/2020  ? ?Lab Results  ?Component Value Date  ? HGBA1C 7.3 (A) 02/04/2022  ? ? ?  ?Assessment & Plan:  ? ?Problem List Items Addressed This Visit   ? ?  ? Cardiovascular and Mediastinum  ? HYPERTENSION, BENIGN SYSTEMIC  ?  BP at goal.   ?  ?  ?  ? Endocrine  ? Type 2 diabetes mellitus with neurological complications ([place]) - Primary  ?  A1C went up to 7.3 this time. We discussed getting more active to improve glucose and continue to work on diet. He would like to stay at 1.37m of Victoza.   ?  ?  ? Relevant Medications  ? liraglutide (VICTOZA) 18 MG/3ML SOPN  ? Other Relevant Orders  ? POCT HgB A1C (Completed)  ? ? ?Meds ordered this encounter  ?Medications  ? liraglutide (VICTOZA) 18 MG/3ML SOPN  ?  Sig: Inject 1.8 mg into the skin daily.  ?  Dispense:  18 mL  ?  Refill:  3  ? ? ?Follow-up: Return in about 3 months (around 05/19/2022) for Diabetes follow-up.  ? ? ?CBeatrice Lecher MD ?

## 2022-02-04 NOTE — Assessment & Plan Note (Signed)
A1C went up to 7.3 this time. We discussed getting more active to improve glucose and continue to work on diet. He would like to stay at 1.'8mg'$  of Victoza.   ?

## 2022-02-04 NOTE — Assessment & Plan Note (Signed)
BP at goal 

## 2022-02-05 NOTE — Telephone Encounter (Signed)
Sent MR request to [place] ?

## 2022-02-27 ENCOUNTER — Emergency Department
Admission: EM | Admit: 2022-02-27 | Discharge: 2022-02-27 | Disposition: A | Discharge status: Home / Self Care | Payer: PPO | Source: Home / Self Care | Attending: Family Medicine | Admitting: Family Medicine

## 2022-02-27 DIAGNOSIS — E1149 Type 2 diabetes mellitus with other diabetic neurological complication: Secondary | ICD-10-CM

## 2022-02-27 DIAGNOSIS — M71122 Other infective bursitis, left elbow: Secondary | ICD-10-CM | POA: Diagnosis not present

## 2022-02-27 DIAGNOSIS — L03114 Cellulitis of left upper limb: Secondary | ICD-10-CM

## 2022-02-27 DIAGNOSIS — R5381 Other malaise: Secondary | ICD-10-CM

## 2022-02-27 MED ORDER — SULFAMETHOXAZOLE-TRIMETHOPRIM 800-160 MG PO TABS
2.0000 | ORAL_TABLET | Freq: Two times a day (BID) | ORAL | 0 refills | Status: AC
Start: 2022-02-27 — End: 2022-03-06

## 2022-02-27 MED ORDER — CEFTRIAXONE SODIUM 1 G IJ SOLR
1000.0000 mg | Freq: Once | INTRAMUSCULAR | Status: AC
  Administered 2022-02-27: 1000 mg via INTRAMUSCULAR

## 2022-02-27 NOTE — ED Triage Notes (Signed)
Pt here today for LT elbow pain and swelling. Hx of bursitis. Tylenol this am. ?

## 2022-02-27 NOTE — ED Provider Notes (Signed)
?[facility] ? ? ? ?CSN: [id number] ?Arrival date & time: 02/27/22  1352 ? ? ?  ? ?History   ?Chief Complaint ?Chief Complaint  ?Patient presents with  ? Elbow Pain  ?  LT  ? ? ?HPI ?Brian Strickland is a 76 y.o. male.  ? ?HPI ?Patient noticed some soreness in his left elbow yesterday.  Today when he got up it was red, swollen, and draining.  It is creasing rapidly in size.  It is painful to touch.  It hurts when he flexes his elbow.  He is also noticed that he feels "blah" with some body aches.  Has not had a fever. ?Patient is diabetic.  He states his sugar was good this morning.  His last A1c was 7.2 ? ?Past Medical History:  ?Diagnosis Date  ? CAD (coronary artery disease)   ? colon ca dx'd 7793/ 2006  ? colon/recurrent  ? Diabetes mellitus   ? Type 2  ? Hypertension   ? Peripheral neuropathy   ? secondary to DM and chemotherapy  ? Urethral meatal stenosis   ? S/P dilatation (Dr. Geroge Baseman)  ? ? ?Patient Active Problem List  ? Diagnosis Date Noted  ? Anxiety disorder 03/06/2021  ? Venous stasis ulcer ([place]) 03/06/2021  ? Bilateral inguinal hernia 08/31/2017  ? Recurrent UTI 02/20/2015  ? Chronic kidney disease (CKD) stage G3b/A2, moderately decreased glomerular filtration rate (GFR) between 30-44 mL/min/1.73 square meter and albuminuria creatinine ratio between 30-299 mg/g ([facility]) 05/19/2014  ? Diabetic nephropathy with proteinuria ([place]) 11/14/2013  ? History of colon cancer 05/25/2013  ? Obesity 12/17/2011  ? Type 2 diabetes mellitus with neurological complications ([place]) 90/30/0923  ? Iron deficiency anemia 09/02/2010  ? LUMBAGO 10/01/2007  ? HYPERCHOLESTEROLEMIA 08/18/2006  ? PERNICIOUS ANEMIA 08/18/2006  ? ERECTILE DYSFUNCTION 08/18/2006  ? NEUROPATHY, PERIPHERAL 08/18/2006  ? HYPERTENSION, BENIGN SYSTEMIC 08/18/2006  ? Coronary atherosclerosis 08/18/2006  ? ? ?Past Surgical History:  ?Procedure Laterality Date  ? cholectomy    ? colon cancer  [phone]  ? recurrence  ? CORONARY ARTERY BYPASS GRAFT    ?  REFRACTIVE SURGERY    ? ? ? ? ? ?Home Medications   ? ?Prior to Admission medications   ?Medication Sig Start Date End Date Taking? Authorizing Provider  ?sulfamethoxazole-trimethoprim (BACTRIM DS) 800-160 MG tablet Take 2 tablets by mouth 2 (two) times daily for 7 days. 02/27/22 03/06/22 Yes Raylene Everts, MD  ?AMBULATORY NON FORMULARY MEDICATION Glucometer of patient and insurance choice.  Diagnosis: Diabetes 01/06/13   Hali Marry, MD  ?aspirin 81 MG tablet Take 81 mg by mouth daily.      [provider]  ?ciprofloxacin (CIPRO) 500 MG tablet Take 1 tablet (500 mg total) by mouth 2 (two) times daily. 10/31/21   Hali Marry, MD  ?clobetasol ointment (TEMOVATE) 2.63 % Apply 1 application topically 2 (two) times daily. Give skin a break after 2 weeks. 08/30/21   Hali Marry, MD  ?diclofenac sodium (VOLTAREN) 1 % GEL APPLY TOPICALLY 4 (FOUR) TIMES DAILY. 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    [provider]  ?insulin aspart (NOVOLOG FLEXPEN) 100 UNIT/ML injection SSI TID with Meals. 12/28/18   Hali Marry, MD  ?insulin detemir (LEVEMIR) 100 UNIT/ML injection Inject 0.5 mLs (50 Units total) into the skin 2 (two) times daily. 12/28/18   Hali Marry,  MD  ?liraglutide (VICTOZA) 18 MG/3ML SOPN Inject 1.8 mg into the skin daily. 02/04/22   Hali Marry, MD  ?lisinopril (PRINIVIL,ZESTRIL) 20 MG tablet Take 20 mg by mouth daily.    Bowen, Collene Leyden, DO  ?pravastatin (PRAVACHOL) 40 MG tablet Take 1 tablet (40 mg total) by mouth at bedtime. 08/20/20   Hali Marry, MD  ?triamcinolone ointment (KENALOG) 0.5 % Apply 1 application topically daily as needed. 01/07/18   Hali Marry, MD  ? ? ?Family History ?Family History  ?Problem Relation Age of Onset  ? Cancer Other   ?     colon/family history  ? ? ?Social History ?Social History   ? ?Tobacco Use  ? Smoking status: Never  ? Smokeless tobacco: Never  ?Substance Use Topics  ? Alcohol use: Yes  ?  Comment: Socially  ? Drug use: No  ? ? ? ?Allergies   ?Morphine, Lyrica [pregabalin], Neurontin [gabapentin], Other, Atorvastatin, Carbamazepine, Invokana [canagliflozin], Metformin and related, Nortriptyline hcl, Oxycodone hcl, Latex, and Neomycin-polymyxin-dexameth ? ? ?Review of Systems ?Review of Systems ? ?See HPI ?Physical Exam ?Triage Vital Signs ?ED Triage Vitals  ?Enc Vitals Group  ?   BP 02/27/22 1403 (!) 143/81  ?   Pulse Rate 02/27/22 1403 86  ?   Resp 02/27/22 1403 17  ?   Temp 02/27/22 1403 98.4 ?F (36.9 ?C)  ?   Temp Source 02/27/22 1403 Oral  ?   SpO2 02/27/22 1403 97 %  ?   Weight --   ?   Height --   ?   Head Circumference --   ?   Peak Flow --   ?   Pain Score 02/27/22 1404 1  ?   Pain Loc --   ?   Pain Edu? --   ?   Excl. in [place]? --   ? ?No data found. ? ?Updated Vital Signs ?BP (!) 143/81 (BP Location: Right Arm)   Pulse 86   Temp 98.4 ?F (36.9 ?C) (Oral)   Resp 17   SpO2 97%  ?    ? ?Physical Exam ?Constitutional:   ?   General: He is not in acute distress. ?   Appearance: He is well-developed. He is not ill-appearing.  ?HENT:  ?   Head: Normocephalic and atraumatic.  ?Eyes:  ?   Conjunctiva/sclera: Conjunctivae normal.  ?   Pupils: Pupils are equal, round, and reactive to light.  ?Cardiovascular:  ?   Rate and Rhythm: Normal rate.  ?Pulmonary:  ?   Effort: Pulmonary effort is normal. No respiratory distress.  ?Abdominal:  ?   General: There is no distension.  ?   Palpations: Abdomen is soft.  ?Musculoskeletal:     ?   General: Normal range of motion.  ?   Cervical back: Normal range of motion.  ?Skin: ?   General: Skin is warm and dry.  ?   Findings: Erythema present.  ?   Comments: Left elbow has a draining lesion directly over the tip of the olecranon process.  There is cellulitis surrounding that measures 15 cm across.  Swelling of the soft tissues.  Moderately tender   ?Neurological:  ?   Mental Status: He is alert.  ?Psychiatric:     ?   Mood and Affect: Mood normal.     ?   Behavior: Behavior normal.  ? ? ? ?UC Treatments / Results  ?Labs ?(all labs ordered are listed, but only abnormal results are displayed) ?  Labs Reviewed - No data to display ? ?EKG ? ? ?Radiology ?No results found. ? ?Procedures ?Procedures (including critical care time) ? ?Medications Ordered in UC ?Medications - No data to display ? ?Initial Impression / Assessment and Plan / UC Course  ?I have reviewed the triage vital signs and the nursing notes. ? ?Pertinent labs & imaging results that were available during my care of the patient were reviewed by me and considered in my medical decision making (see chart for details). ? ?  ? ?I have concern because his infection is spreading rapidly and that the patient is feeling malaise.  I feel it is a systemic symptoms associated with this infection.  I am going to start with a gram of Rocephin IM followed by Septra DS 2 times a day.  He needs to follow-up with his primary care early next week. ?Final Clinical Impressions(s) / UC Diagnoses  ? ?Final diagnoses:  ?Infected olecranon bursa, left  ?Cellulitis of left upper extremity  ?Malaise  ?Type 2 diabetes mellitus with neurological complications ([place])  ? ? ? ?Discharge Instructions   ? ?  ?Take the antibiotic as directed.  Take 2 pills in the morning and 2 pills at night.  Take with food ?Call tomorrow to make an appointment to see Dr. Madilyn Fireman or Dr. Dianah Field 1 day next week. ?If you get worse instead of better with increasing redness and pain, or fever, you must go to the ER ? ? ?ED Prescriptions   ? ? Medication Sig Dispense Auth. Provider  ? sulfamethoxazole-trimethoprim (BACTRIM DS) 800-160 MG tablet Take 2 tablets by mouth 2 (two) times daily for 7 days. 28 tablet Raylene Everts, MD  ? ?  ? ?PDMP not reviewed this encounter. ?  ?Raylene Everts, MD ?02/27/22 1441 ? ?

## 2022-02-27 NOTE — Discharge Instructions (Signed)
Take the antibiotic as directed.  Take 2 pills in the morning and 2 pills at night.  Take with food ?Call tomorrow to make an appointment to see Dr. Madilyn Fireman or Dr. Dianah Field 1 day next week. ?If you get worse instead of better with increasing redness and pain, or fever, you must go to the ER ?

## 2022-03-12 LAB — HM DIABETES EYE EXAM

## 2022-05-19 ENCOUNTER — Encounter: Payer: Self-pay | Admitting: Family Medicine

## 2022-05-19 ENCOUNTER — Ambulatory Visit (INDEPENDENT_AMBULATORY_CARE_PROVIDER_SITE_OTHER): Payer: PPO | Admitting: Family Medicine

## 2022-05-19 VITALS — BP 117/65 | HR 94 | Ht 74.0 in | Wt 282.0 lb

## 2022-05-19 DIAGNOSIS — G609 Hereditary and idiopathic neuropathy, unspecified: Secondary | ICD-10-CM | POA: Diagnosis not present

## 2022-05-19 DIAGNOSIS — I10 Essential (primary) hypertension: Secondary | ICD-10-CM | POA: Diagnosis not present

## 2022-05-19 DIAGNOSIS — N1832 Chronic kidney disease, stage 3b: Secondary | ICD-10-CM

## 2022-05-19 DIAGNOSIS — E1149 Type 2 diabetes mellitus with other diabetic neurological complication: Secondary | ICD-10-CM | POA: Diagnosis not present

## 2022-05-19 LAB — POCT GLYCOSYLATED HEMOGLOBIN (HGB A1C): Hemoglobin A1C: 7.1 % — AB (ref 4.0–5.6)

## 2022-05-19 NOTE — Progress Notes (Signed)
Established Patient Office Visit  Subjective   Patient ID: Brian Strickland, male    DOB: [birth year]  Age: 76 y.o. MRN: [id number]  Chief Complaint  Patient presents with   Diabetes    HPI  Diabetes - no hypoglycemic events. No wounds or sores that are not healing well. No increased thirst or urination. Checking glucose at home. Taking medications as prescribed without any side effects. Down 8 lbs from last time.  He has been doing well with the Victoza but did hit his Medicare gap.  He thinks he would be unlikely to qualify for any patient assistance.  That particular medication is not covered through the [place].  Still using about 35 units twice a day on the Levemir he feels like his blood sugars have been very consistent he really has not had a lot of highs or lows and is very happy with that regimen.  Hypertension- Pt denies chest pain, SOB, dizziness, or heart palpitations.  Taking meds as directed w/o problems.  Denies medication side effects.    Still dealing with his neuropathy.  Recently had eye exam done at the [facility] May 2023.  Says he gets labs done there every 6 months.     ROS    Objective:     BP 117/65   Pulse 94   Ht '6\' 2"'$  (1.88 m)   Wt 282 lb (127.9 kg)   SpO2 98%   BMI 36.21 kg/m    Physical Exam Constitutional:      Appearance: He is well-developed.  HENT:     Head: Normocephalic and atraumatic.  Cardiovascular:     Rate and Rhythm: Normal rate and regular rhythm.     Heart sounds: Normal heart sounds.  Pulmonary:     Effort: Pulmonary effort is normal.     Breath sounds: Normal breath sounds.  Skin:    General: Skin is warm and dry.  Neurological:     Mental Status: He is alert and oriented to person, place, and time.  Psychiatric:        Behavior: Behavior normal.      Results for orders placed or performed in visit on 05/19/22  POCT glycosylated hemoglobin (Hb A1C)  Result Value Ref Range   Hemoglobin A1C 7.1 (A) 4.0 - 5.6 %    HbA1c POC (<> result, manual entry)     HbA1c, POC (prediabetic range)     HbA1c, POC (controlled diabetic range)        The 10-year ASCVD risk score (Arnett DK, et al., 2019) is: 45.1%    Assessment & Plan:   Problem List Items Addressed This Visit       Cardiovascular and Mediastinum   HYPERTENSION, BENIGN SYSTEMIC    Well controlled. Continue current regimen. Follow up in  6 mo       Relevant Orders   PSA   COMPLETE METABOLIC PANEL WITH GFR   Lipid panel     Endocrine   Type 2 diabetes mellitus with neurological complications (HCC) - Primary    A1C improved.  Down to 7.1 from 7.3.  He is doing well on current regimen.  Is also lost about 8 pounds which is fantastic.  Follow-up in 3 to 4 months.  He gets blood work done at the [place] regularly hopefully we can try to get a copy of that.  On ACE inhibitor and statin.      Relevant Orders   POCT glycosylated hemoglobin (Hb A1C) (Completed)  PSA   COMPLETE METABOLIC PANEL WITH GFR   Lipid panel     Nervous and Auditory   Hereditary and idiopathic peripheral neuropathy    Usually takes 2 tylenol and a sleep aid at night to help him sleep so he can get comfortable with his neuropathy that usually works well most nights.        Genitourinary   Chronic kidney disease (CKD) stage G3b/A2, moderately decreased glomerular filtration rate (GFR) between 30-44 mL/min/1.73 square meter and albuminuria creatinine ratio between 30-299 mg/g (HCC)   Relevant Orders   PSA   COMPLETE METABOLIC PANEL WITH GFR   Lipid panel    Return in about 4 months (around 09/19/2022) for Diabetes follow-up, Hypertension.    Beatrice Lecher, MD

## 2022-05-19 NOTE — Assessment & Plan Note (Signed)
Usually takes 2 tylenol and a sleep aid at night to help him sleep so he can get comfortable with his neuropathy that usually works well most nights.

## 2022-05-19 NOTE — Assessment & Plan Note (Signed)
Well controlled. Continue current regimen. Follow up in  6 mo  

## 2022-05-19 NOTE — Assessment & Plan Note (Addendum)
A1C improved.  Down to 7.1 from 7.3.  He is doing well on current regimen.  Is also lost about 8 pounds which is fantastic.  Follow-up in 3 to 4 months.  He gets blood work done at the [place] regularly hopefully we can try to get a copy of that.  On ACE inhibitor and statin.

## 2022-07-02 ENCOUNTER — Encounter: Payer: Self-pay | Admitting: General Practice

## 2022-07-10 LAB — MICROALBUMIN / CREATININE URINE RATIO: Microalb Creat Ratio: 1.6

## 2022-09-05 DIAGNOSIS — Z125 Encounter for screening for malignant neoplasm of prostate: Secondary | ICD-10-CM | POA: Diagnosis not present

## 2022-09-05 DIAGNOSIS — E1149 Type 2 diabetes mellitus with other diabetic neurological complication: Secondary | ICD-10-CM | POA: Diagnosis not present

## 2022-09-05 DIAGNOSIS — I10 Essential (primary) hypertension: Secondary | ICD-10-CM | POA: Diagnosis not present

## 2022-09-05 DIAGNOSIS — N1832 Chronic kidney disease, stage 3b: Secondary | ICD-10-CM | POA: Diagnosis not present

## 2022-09-06 LAB — COMPLETE METABOLIC PANEL WITH GFR
AG Ratio: 1.3 (calc) (ref 1.0–2.5)
ALT: 14 U/L (ref 9–46)
AST: 18 U/L (ref 10–35)
Albumin: 4.1 g/dL (ref 3.6–5.1)
Alkaline phosphatase (APISO): 60 U/L (ref 35–144)
BUN/Creatinine Ratio: 19 (calc) (ref 6–22)
BUN: 26 mg/dL — ABNORMAL HIGH (ref 7–25)
CO2: 27 mmol/L (ref 20–32)
Calcium: 9.5 mg/dL (ref 8.6–10.3)
Chloride: 105 mmol/L (ref 98–110)
Creat: 1.4 mg/dL — ABNORMAL HIGH (ref 0.70–1.28)
Globulin: 3.1 g/dL (calc) (ref 1.9–3.7)
Glucose, Bld: 152 mg/dL — ABNORMAL HIGH (ref 65–99)
Potassium: 4.6 mmol/L (ref 3.5–5.3)
Sodium: 140 mmol/L (ref 135–146)
Total Bilirubin: 0.5 mg/dL (ref 0.2–1.2)
Total Protein: 7.2 g/dL (ref 6.1–8.1)
eGFR: 52 mL/min/{1.73_m2} — ABNORMAL LOW (ref >=60)

## 2022-09-06 LAB — PSA: PSA: 6.44 ng/mL — ABNORMAL HIGH (ref <=4.00)

## 2022-09-06 LAB — LIPID PANEL
Cholesterol: 177 mg/dL (ref <200)
HDL: 44 mg/dL (ref >=40)
LDL Cholesterol (Calc): 106 mg/dL (calc) — ABNORMAL HIGH
Non-HDL Cholesterol (Calc): 133 mg/dL (calc) — ABNORMAL HIGH (ref <130)
Total CHOL/HDL Ratio: 4 (calc) (ref <5.0)
Triglycerides: 154 mg/dL — ABNORMAL HIGH (ref <150)

## 2022-09-08 NOTE — Progress Notes (Signed)
Hi Brian Strickland, we can go over your results next week. PSA is stable. Kidney function is stable. We will discuss your cholesterol.

## 2022-09-19 ENCOUNTER — Telehealth: Payer: Self-pay | Admitting: *Deleted

## 2022-09-19 ENCOUNTER — Ambulatory Visit (INDEPENDENT_AMBULATORY_CARE_PROVIDER_SITE_OTHER): Payer: PPO | Admitting: Family Medicine

## 2022-09-19 ENCOUNTER — Encounter: Payer: Self-pay | Admitting: Family Medicine

## 2022-09-19 VITALS — BP 128/59 | HR 83 | Ht 74.0 in | Wt 282.0 lb

## 2022-09-19 DIAGNOSIS — E1149 Type 2 diabetes mellitus with other diabetic neurological complication: Secondary | ICD-10-CM | POA: Diagnosis not present

## 2022-09-19 DIAGNOSIS — I10 Essential (primary) hypertension: Secondary | ICD-10-CM | POA: Diagnosis not present

## 2022-09-19 DIAGNOSIS — E1121 Type 2 diabetes mellitus with diabetic nephropathy: Secondary | ICD-10-CM | POA: Diagnosis not present

## 2022-09-19 DIAGNOSIS — Z77098 Contact with and (suspected) exposure to other hazardous, chiefly nonmedicinal, chemicals: Secondary | ICD-10-CM | POA: Insufficient documentation

## 2022-09-19 DIAGNOSIS — H25813 Combined forms of age-related cataract, bilateral: Secondary | ICD-10-CM | POA: Insufficient documentation

## 2022-09-19 LAB — POCT GLYCOSYLATED HEMOGLOBIN (HGB A1C): Hemoglobin A1C: 7.5 % — AB (ref 4.0–5.6)

## 2022-09-19 MED ORDER — VICTOZA 18 MG/3ML ~~LOC~~ SOPN: 2.4000 mg | PEN_INJECTOR | Freq: Every day | SUBCUTANEOUS | 1 refills | Status: DC

## 2022-09-19 NOTE — Telephone Encounter (Signed)
Okay, please let patient know.

## 2022-09-19 NOTE — Assessment & Plan Note (Signed)
A1c in the sevens today.  He is actually doing really well.  Has been tolerating the Victoza well without any major side effects.  We will go ahead and increase his dose to 2.4 mg.  After a couple months if he is doing great on that we can increase to 3 mg.  I am hoping that this will limit his use of the insulin.  And maybe after the new year we can switch back to Ozempic and reduce his number of injections daily.  Continue to work on [employer] and staying active.  His walking is limited.

## 2022-09-19 NOTE — Telephone Encounter (Signed)
Brian Strickland with [facility] called and stated that pt's insurance will only cover the max dose of 1.8 mg of Victoza.

## 2022-09-19 NOTE — Assessment & Plan Note (Signed)
Well controlled. Continue current regimen. Follow up in  4 mo 

## 2022-09-19 NOTE — Progress Notes (Signed)
   Established Patient Office Visit  Subjective   Patient ID: Brian Strickland, male    DOB: 1946-05-31  Age: 76 y.o. MRN: [id number]  Chief Complaint  Patient presents with   Diabetes    HPI  Diabetes - no hypoglycemic events. No wounds or sores that are not healing well. No increased thirst or urination. Checking glucose at home. Taking medications as prescribed without any side effects. Using 22-23 units of Levemir BID.    Hypertension- Pt denies chest pain, SOB, dizziness, or heart palpitations.  Taking meds as directed w/o problems.  Denie s medication side effects.       ROS    Objective:     BP (!) 128/59   Pulse 83   Ht '6\' 2"'$  (1.88 m)   Wt 282 lb (127.9 kg)   SpO2 97%   BMI 36.21 kg/m    Physical Exam Constitutional:      Appearance: He is well-developed.  HENT:     Head: Normocephalic and atraumatic.  Cardiovascular:     Rate and Rhythm: Normal rate and regular rhythm.     Heart sounds: Normal heart sounds.  Pulmonary:     Effort: Pulmonary effort is normal.     Breath sounds: Normal breath sounds.  Skin:    General: Skin is warm and dry.  Neurological:     Mental Status: He is alert and oriented to person, place, and time.  Psychiatric:        Behavior: Behavior normal.      Results for orders placed or performed in visit on 09/19/22  Microalbumin / creatinine urine ratio  Result Value Ref Range   Microalb Creat Ratio 1.6   POCT glycosylated hemoglobin (Hb A1C)  Result Value Ref Range   Hemoglobin A1C 7.5 (A) 4.0 - 5.6 %   HbA1c POC (<> result, manual entry)     HbA1c, POC (prediabetic range)     HbA1c, POC (controlled diabetic range)        The 10-year ASCVD risk score (Arnett DK, et al., 2019) is: 52.4%    Assessment & Plan:   Problem List Items Addressed This Visit       Cardiovascular and Mediastinum   HYPERTENSION, BENIGN SYSTEMIC - Primary    Well controlled. Continue current regimen. Follow up in  4 mo         Endocrine    Type 2 diabetes mellitus with neurological complications (HCC)    X4J in the sevens today.  He is actually doing really well.  Has been tolerating the Victoza well without any major side effects.  We will go ahead and increase his dose to 2.4 mg.  After a couple months if he is doing great on that we can increase to 3 mg.  I am hoping that this will limit his use of the insulin.  And maybe after the new year we can switch back to Ozempic and reduce his number of injections daily.  Continue to work on [employer] and staying active.  His walking is limited.      Relevant Medications   liraglutide (VICTOZA) 18 MG/3ML SOPN   Other Relevant Orders   POCT glycosylated hemoglobin (Hb A1C) (Completed)   Diabetic nephropathy with proteinuria (HCC)   Relevant Medications   liraglutide (VICTOZA) 18 MG/3ML SOPN    Return in about 4 months (around 01/18/2023) for Diabetes follow-up.    Beatrice Lecher, MD

## 2022-11-21 ENCOUNTER — Telehealth: Payer: Self-pay | Admitting: *Deleted

## 2022-11-21 ENCOUNTER — Encounter: Payer: Self-pay | Admitting: Family Medicine

## 2022-11-21 DIAGNOSIS — E1149 Type 2 diabetes mellitus with other diabetic neurological complication: Secondary | ICD-10-CM

## 2022-11-21 NOTE — Telephone Encounter (Signed)
Pt's wife brought in a letter from his insurance company stating that they will no longer pay for Victoza.

## 2022-11-24 MED ORDER — TIRZEPATIDE 5 MG/0.5ML ~~LOC~~ SOAJ: 5.0000 mg | SUBCUTANEOUS | 0 refills | Status: DC

## 2022-11-24 MED ORDER — TIRZEPATIDE 2.5 MG/0.5ML ~~LOC~~ SOAJ: 2.5000 mg | SUBCUTANEOUS | 0 refills | Status: DC

## 2022-11-24 NOTE — Telephone Encounter (Signed)
Insurance reports that they will no longer cover Victoza they did provide him a 1 month temporary supply.  Unfortunately the latter that he dropped off did not give [place] any formulary alternatives.  It looks like they will most likely cover Trulicity or Mounjaro.  Some get a send over prescription for the Mounjaro 2.5 mg to inject once weekly for a month and then go up to 5 mg weekly for a month.  If he is tolerating it well at that point we can continue to adjust the dose or stay at that dose if he is having a lot of nausea with it.  Make sure to continue to work on healthy food choices and staying active.  If he has any problems at the pharmacy please let us know.  He may still have a deductible for this year so we may want to speak with the pharmacist as he is feeling the medication.  Meds ordered this encounter  Medications   tirzepatide (MOUNJARO) 2.5 MG/0.5ML Pen    Sig: Inject 2.5 mg into the skin once a week.    Dispense:  2 mL    Refill:  0   tirzepatide (MOUNJARO) 5 MG/0.5ML Pen    Sig: Inject 5 mg into the skin once a week.    Dispense:  2 mL    Refill:  0

## 2022-11-26 ENCOUNTER — Encounter: Payer: Self-pay | Admitting: Family Medicine

## 2022-11-26 DIAGNOSIS — E1149 Type 2 diabetes mellitus with other diabetic neurological complication: Secondary | ICD-10-CM

## 2022-11-26 MED ORDER — RYBELSUS 7 MG PO TABS: 7.0000 mg | ORAL_TABLET | Freq: Every day | ORAL | 0 refills | Status: DC

## 2022-11-26 MED ORDER — RYBELSUS 3 MG PO TABS: 3.0000 mg | ORAL_TABLET | Freq: Every day | ORAL | 0 refills | Status: DC

## 2022-11-26 NOTE — Telephone Encounter (Signed)
Spoke w/pt and he had also contacted his insurance company and sent a my chart about this he stated that they would cover either of the following and he would be ok with which ever Dr. Madilyn Fireman thought would be best.     Ins. [place] rebelsus and trulicity are on same formulary tier as victoza used to be.  These two are prior authorized for your prescription to be forwarded to pharmacy.     Pt's pharmacy confirmed ([facility])

## 2022-11-26 NOTE — Telephone Encounter (Signed)
Hi Key, I think will need a PA  Meds ordered this encounter  Medications   Semaglutide (RYBELSUS) 3 MG TABS    Sig: Take 3 mg by mouth daily.    Dispense:  30 tablet    Refill:  0   Semaglutide (RYBELSUS) 7 MG TABS    Sig: Take 7 mg by mouth daily.    Dispense:  30 tablet    Refill:  0

## 2022-11-27 ENCOUNTER — Telehealth: Payer: Self-pay

## 2022-11-27 NOTE — Telephone Encounter (Addendum)
Initiated Prior authorization [id number] '3MG'$  tablets Via: Covermymeds Case/Key:[id number] faxed pa Status: approved  as of 11/27/21 Reason:Approved through 11/29/23 Notified Pt via: Mychart  Medication was denied 11/16/22, pt initiated their own pa

## 2023-01-16 ENCOUNTER — Other Ambulatory Visit: Payer: Self-pay | Admitting: Pharmacist

## 2023-01-16 NOTE — Progress Notes (Signed)
Patient appearing on report for levemir usage in setting of upcoming levemir manufacturer discontinuation. Next appointment with PCP is 01/19/23   Outreached patient to discuss  glucose control and medication management.   Diabetes:  Current medications: rybelsus '3mg'$  daily (not yet increased to '7mg'$ ), levemir 22units BID was switched to lantus 18-20 units BID by his [facility] provider, novolog 18 units TID with meals Medications tried in the past: victoza stopped due to high cost, denied   Current glucose readings: patient mentions blood sugars have been more sporadic as of lately  Current medication access support: fills most meds via [employer], but also somewhat connected to [place]?   Diabetes: - Currently uncontrolled but plans are in place with adjustment of medications  - Recommend to pick up rybelsus '7mg'$  daily at his pharmacy and increase from '3mg'$  to '7mg'$ .     Larinda Buttery, PharmD Clinical Pharmacist [facility] [phone]

## 2023-01-16 NOTE — Patient Instructions (Signed)
Aron,  Thank you for speaking with me today. As discussed, keep insulins the same, and pick up the rybelsus at your pharmacy to increase to the '7mg'$  dose.   Let me know if you have any trouble obtaining that from the pharmacy.  Have a great weekend, Luana Shu, PharmD Clinical Pharmacist [facility] [phone]

## 2023-01-19 ENCOUNTER — Encounter: Payer: Self-pay | Admitting: Family Medicine

## 2023-01-19 ENCOUNTER — Ambulatory Visit (INDEPENDENT_AMBULATORY_CARE_PROVIDER_SITE_OTHER): Payer: PPO | Admitting: Family Medicine

## 2023-01-19 VITALS — BP 115/51 | HR 78 | Ht 74.0 in | Wt 282.0 lb

## 2023-01-19 DIAGNOSIS — I10 Essential (primary) hypertension: Secondary | ICD-10-CM | POA: Diagnosis not present

## 2023-01-19 DIAGNOSIS — E1149 Type 2 diabetes mellitus with other diabetic neurological complication: Secondary | ICD-10-CM

## 2023-01-19 DIAGNOSIS — E1121 Type 2 diabetes mellitus with diabetic nephropathy: Secondary | ICD-10-CM

## 2023-01-19 LAB — POCT GLYCOSYLATED HEMOGLOBIN (HGB A1C): Hemoglobin A1C: 7.4 % — AB (ref 4.0–5.6)

## 2023-01-19 NOTE — Assessment & Plan Note (Addendum)
He is actually doing okay with the Rybelsus he is just picked up the 7 mg today and will start that this week.  He will likely end up switching back to pick toes in a couple of weeks when he follows up at the [place].  He would like to get back on the Victoza he did well on it and was happy with it and he was able to tolerate the highest regimen without any significant nausea or side effects.  He is off of the Levemir now and on Lantus 20 units and also uses his mealtime insulin.  I love to see him be able to decrease his insulin usage if we can get his A1c down.  Ports that his eye exams are up-to-date at the [place] and he is actually scheduled for his next one for this year in about 3 months.

## 2023-01-19 NOTE — Assessment & Plan Note (Signed)
Pressure looks phenomenal if anything is on the lower end of normal.  Just encouraged him to monitor it if he starts feeling lightheaded or dizzy with position change or standing up try to check the blood pressure if it is low he can always split his lisinopril if needed.

## 2023-01-19 NOTE — Progress Notes (Signed)
Established Patient Office Visit  Subjective   Patient ID: Brian Strickland, male    DOB: [birth year]  Age: 77 y.o. MRN: [id number]  Chief Complaint  Patient presents with   Diabetes    HPI  Diabetes - no hypoglycemic events. No wounds or sores that are not healing well. No increased thirst or urination. Checking glucose at home. Taking medications as prescribed without any side effects.  Been doubling up on the 3 mg Rybelsus awaiting his 7 mg dose.  There was a delay.  Also on NovoLog and Lantus.  He was previously on Victoza.  He says his glucose levels have been really up-and-down since switching to Rybelsus.  The Victoza will likely be covered this year by the [place].  Hypertension- Pt denies chest pain, SOB, dizziness, or heart palpitations.  Taking meds as directed w/o problems.  Denies medication side effects.    He recently had several skin lesions removed.  He is referred to the Mohs surgeon for further treatment.    ROS    Objective:     BP (!) 115/51   Pulse 78   Ht '6\' 2"'$  (1.88 m)   Wt 282 lb (127.9 kg)   SpO2 98%   BMI 36.21 kg/m    Physical Exam Constitutional:      Appearance: He is well-developed.  HENT:     Head: Normocephalic and atraumatic.  Cardiovascular:     Rate and Rhythm: Normal rate and regular rhythm.     Heart sounds: Normal heart sounds.  Pulmonary:     Effort: Pulmonary effort is normal.     Breath sounds: Normal breath sounds.  Skin:    General: Skin is warm and dry.  Neurological:     Mental Status: He is alert and oriented to person, place, and time.  Psychiatric:        Behavior: Behavior normal.      Results for orders placed or performed in visit on 01/19/23  POCT glycosylated hemoglobin (Hb A1C)  Result Value Ref Range   Hemoglobin A1C 7.4 (A) 4.0 - 5.6 %   HbA1c POC (<> result, manual entry)     HbA1c, POC (prediabetic range)     HbA1c, POC (controlled diabetic range)        The 10-year ASCVD risk score (Arnett DK, et  al., 2019) is: 45.8%    Assessment & Plan:   Problem List Items Addressed This Visit       Cardiovascular and Mediastinum   HYPERTENSION, BENIGN SYSTEMIC    Pressure looks phenomenal if anything is on the lower end of normal.  Just encouraged him to monitor it if he starts feeling lightheaded or dizzy with position change or standing up try to check the blood pressure if it is low he can always split his lisinopril if needed.        Endocrine   Type 2 diabetes mellitus with neurological complications ([place]) - Primary    He is actually doing okay with the Rybelsus he is just picked up the 7 mg today and will start that this week.  He will likely end up switching back to pick toes in a couple of weeks when he follows up at the [place].  He would like to get back on the Victoza he did well on it and was happy with it and he was able to tolerate the highest regimen without any significant nausea or side effects.  He is off of the Levemir now and  on Lantus 20 units and also uses his mealtime insulin.  I love to see him be able to decrease his insulin usage if we can get his A1c down.  Ports that his eye exams are up-to-date at the [place] and he is actually scheduled for his next one for this year in about 3 months.      Relevant Orders   POCT glycosylated hemoglobin (Hb A1C) (Completed)   Diabetic nephropathy with proteinuria (HCC)    Conitinue ACEi.       Will get eye exam abstracted we were able to get a copy of the report through the [place].  Return in about 4 months (around 05/21/2023) for Diabetes follow-up.    Beatrice Lecher, MD

## 2023-01-19 NOTE — Assessment & Plan Note (Signed)
Conitinue ACEi.

## 2023-01-23 ENCOUNTER — Encounter: Payer: Self-pay | Admitting: Family Medicine

## 2023-02-05 LAB — MICROALBUMIN / CREATININE URINE RATIO: Microalb Creat Ratio: 34.8

## 2023-02-05 LAB — PROTEIN / CREATININE RATIO, URINE
Albumin, U: 3.55
Creatinine, Urine: 102

## 2023-02-16 ENCOUNTER — Other Ambulatory Visit: Payer: Self-pay | Admitting: Family Medicine

## 2023-02-16 DIAGNOSIS — E1149 Type 2 diabetes mellitus with other diabetic neurological complication: Secondary | ICD-10-CM

## 2023-03-04 ENCOUNTER — Telehealth: Payer: Self-pay | Admitting: Family Medicine

## 2023-03-04 NOTE — Telephone Encounter (Signed)
Contacted Brynda Peon to schedule their annual wellness visit. Patient declined to schedule AWV at this time.  Cira Servant Patient Engineer, production II Direct Dial: [phone]

## 2023-03-11 LAB — HM DIABETES EYE EXAM

## 2023-03-19 ENCOUNTER — Encounter: Payer: Self-pay | Admitting: Family Medicine

## 2023-03-19 ENCOUNTER — Ambulatory Visit (INDEPENDENT_AMBULATORY_CARE_PROVIDER_SITE_OTHER): Payer: PPO | Admitting: Family Medicine

## 2023-03-19 VITALS — BP 117/61 | HR 78 | Ht 74.0 in | Wt 277.0 lb

## 2023-03-19 DIAGNOSIS — I872 Venous insufficiency (chronic) (peripheral): Secondary | ICD-10-CM

## 2023-03-19 MED ORDER — FUROSEMIDE 20 MG PO TABS: 20.0000 mg | ORAL_TABLET | Freq: Every day | ORAL | 0 refills | Status: DC | PRN

## 2023-03-19 MED ORDER — DOXYCYCLINE HYCLATE 100 MG PO TABS
100.0000 mg | ORAL_TABLET | Freq: Two times a day (BID) | ORAL | 0 refills | Status: DC
Start: 2023-03-19 — End: 2023-05-21

## 2023-03-19 NOTE — Addendum Note (Signed)
Addended by: [name] D on: 03/19/2023 10:39 AM   Modules accepted: Orders

## 2023-03-19 NOTE — Progress Notes (Addendum)
Acute Office Visit  Subjective:     Patient ID: Brian Strickland, male    DOB: [birth year], 77 y.o.   MRN: [id number]  Chief Complaint  Patient presents with   Wound Check    HPI Patient is in today for wounds on both lower legs.  He had met with dermatologist at the [facility] and they had recommended that he start wearing compression stockings he has neuropathy so he been putting them on and thinks maybe he was hitting or rubbing the skin too hard and just could not tell because he really cannot feel and now the skin is swollen and red and there is a little bit of an exudate and drainage.  Please see photos below.  No odor.  But there is some erythema.  He has had a similar wound in the past but not quite to this level and it was treated with DuoDERM and wrappings and oral Doxy and he did really well.  He request to follow a similar treatment plan.  No fevers or chills.  Is been using a barrier protectant on called N[place]kin  ROS      Objective:    BP 117/61   Pulse 78   Ht 6\' 2"  (1.88 m)   Wt 277 lb (125.6 kg)   SpO2 99%   BMI 35.56 kg/m    Physical Exam Vitals reviewed.  Constitutional:      Appearance: He is well-developed.  HENT:     Head: Normocephalic and atraumatic.  Eyes:     Conjunctiva/sclera: Conjunctivae normal.  Cardiovascular:     Rate and Rhythm: Normal rate.  Pulmonary:     Effort: Pulmonary effort is normal.  Skin:    General: Skin is dry.     Coloration: Skin is not pale.  Neurological:     Mental Status: He is alert and oriented to person, place, and time.  Psychiatric:        Behavior: Behavior normal.       No results found for any visits on 03/19/23.  Area affected on right medial leg measuring approximately 10 x 12 cm.  Smaller area on right medial lower leg below the knee measuring 4 x 5 cm.  Serous drainage is present.     Assessment & Plan:   Problem List Items Addressed This Visit   None Visit Diagnoses     Stasis dermatitis of both  legs    -  Primary   Relevant Medications   furosemide (LASIX) 20 MG tablet      Will apply DuoDERM since there is some weeping of the skin around that area and then apply Coban on top to help keep it in place.  And then an Ace wrap from mid foot to below the knee for compression.  Normally I would see him back in 3 days but that be Sunday so I will see him back on Monday and we will take a look and then decide if we need to rewrap or can potentially switch to an Unna boot at that point.  There is concern for early cellulitis so we will go ahead and treat with doxycycline as well.  consider necrosis lipoidica diabetic on the differential as well.  Might respond well to a topical steroid once we get some of the inflammation and drainage down.  Meds ordered this encounter  Medications   doxycycline (VIBRA-TABS) 100 MG tablet    Sig: Take 1 tablet (100 mg total) by mouth 2 (two)  times daily.    Dispense:  14 tablet    Refill:  0   furosemide (LASIX) 20 MG tablet    Sig: Take 1 tablet (20 mg total) by mouth daily as needed.    Dispense:  10 tablet    Refill:  0    Return in about 4 days (around 03/23/2023) for Would check, lower legs. .   I spent 40 minutes on the day of the encounter to include pre-visit record review, face-to-face time with the patient and post visit ordering of test.  [name], MD

## 2023-03-23 ENCOUNTER — Ambulatory Visit (INDEPENDENT_AMBULATORY_CARE_PROVIDER_SITE_OTHER): Payer: PPO | Admitting: Family Medicine

## 2023-03-23 ENCOUNTER — Encounter: Payer: Self-pay | Admitting: Family Medicine

## 2023-03-23 VITALS — Wt 277.0 lb

## 2023-03-23 DIAGNOSIS — I872 Venous insufficiency (chronic) (peripheral): Secondary | ICD-10-CM | POA: Diagnosis not present

## 2023-03-23 NOTE — Progress Notes (Signed)
   Acute Office Visit  Subjective:     Patient ID: LATRE LEATHERS, male    DOB: 14-Jul-1946, 77 y.o.   MRN: [id number]  Chief Complaint  Patient presents with   Wound Check    HPI Patient is in today for follow-up wound care of both lower extremities.  We Bydureon arm on because the wounds were exudative while he was here he has been wrapping with Ace wraps and so a lot of the edema has improved as well.  He did go and take everything off and take a shower this morning.  ROS      Objective:    Wt 277 lb (125.6 kg)   BMI 35.56 kg/m    Physical Exam Vitals reviewed.  Constitutional:      Appearance: He is well-developed.  HENT:     Head: Normocephalic and atraumatic.  Eyes:     Conjunctiva/sclera: Conjunctivae normal.  Cardiovascular:     Rate and Rhythm: Normal rate.  Pulmonary:     Effort: Pulmonary effort is normal.  Skin:    General: Skin is dry.     Coloration: Skin is not pale.  Neurological:     Mental Status: He is alert and oriented to person, place, and time.  Psychiatric:        Behavior: Behavior normal.     No results found for any visits on 03/23/23.      Assessment & Plan:   Problem List Items Addressed This Visit   None Visit Diagnoses     Stasis dermatitis of both legs    -  Primary      Legs do look better today.  A lot of the thick scale has peeled and there is no longer weeping present.  The skin is still very dry but some of the thickness has decreased.  The pustules seem to have resolved as well.  He still has about 2 more days on the doxycycline encouraged him to finish that we applied Xeroform today with nonstick gauze Curlex and an Ace wrap on top.  He felt like the Coban was too compressive and caused a lot of edema in his toes.  We discussed trying to elevate his feet for 10 to 15 minutes at least 3 times a day, late morning, after lunch and then may be late afternoon.  He did try the Lasix for few days but really did not notice  any improvement in his symptoms he does have a few tabs left so we did discuss that he could try taking 2 but right now his swelling actually looks okay so would also be okay to just monitor.  Recommend change dressing in 2 days and then follow-up in 4 days.  No orders of the defined types were placed in this encounter.   Return in about 4 days (around 03/27/2023) for wound check on nurse schedule .  [name], MD

## 2023-03-26 LAB — HM DIABETES EYE EXAM

## 2023-03-27 ENCOUNTER — Ambulatory Visit (INDEPENDENT_AMBULATORY_CARE_PROVIDER_SITE_OTHER): Payer: PPO | Admitting: Family Medicine

## 2023-03-27 VITALS — BP 116/64 | HR 85

## 2023-03-27 DIAGNOSIS — E113292 Type 2 diabetes mellitus with mild nonproliferative diabetic retinopathy without macular edema, left eye: Secondary | ICD-10-CM

## 2023-03-27 DIAGNOSIS — E11319 Type 2 diabetes mellitus with unspecified diabetic retinopathy without macular edema: Secondary | ICD-10-CM | POA: Insufficient documentation

## 2023-03-27 DIAGNOSIS — I872 Venous insufficiency (chronic) (peripheral): Secondary | ICD-10-CM | POA: Diagnosis not present

## 2023-03-27 DIAGNOSIS — Z794 Long term (current) use of insulin: Secondary | ICD-10-CM | POA: Diagnosis not present

## 2023-03-27 MED ORDER — FUROSEMIDE 20 MG PO TABS: 20.0000 mg | ORAL_TABLET | Freq: Every day | ORAL | 1 refills | Status: DC | PRN

## 2023-03-27 NOTE — Progress Notes (Signed)
   Established Patient Office Visit  Subjective   Patient ID: Brian Strickland, male    DOB: 05/26/1946  Age: 77 y.o. MRN: [id number]  Chief Complaint  Patient presents with   Wound Check    HPI  Here today for wound check on his lower extremities.  He did remove the wrappings this morning and took a shower.  They look great.  The wounds are almost completely closed.  He says there was no drainage or weeping at all on the bandages when he took them off.  He still has some skin sloughing.  He said he did end up taking 2 of the Lasix at 1 time and that seemed to make a big difference and pulling some of the extra fluid off.  He feels like that is been helpful and so would like a new prescription so that he can take 2 as needed.    ROS    Objective:     BP 116/64   Pulse 85   SpO2 99%    Physical Exam   Results for orders placed or performed in visit on 03/27/23  HM DIABETES EYE EXAM  Result Value Ref Range   HM Diabetic Eye Exam No Retinopathy No Retinopathy  HM DIABETES EYE EXAM  Result Value Ref Range   HM Diabetic Eye Exam Retinopathy (A) No Retinopathy      The 10-year ASCVD risk score (Arnett DK, et al., 2019) is: 46.8%* (Cholesterol units were assumed)    Assessment & Plan:   Problem List Items Addressed This Visit       Endocrine   Diabetic retinopathy (HCC)    He did have his diabetic eye exam performed at the [facility] yesterday which did show some mild retinopathy.  Will get the chart updated and get the exam note scanned in.      Other Visit Diagnoses     Stasis dermatitis of both legs    -  Primary   Relevant Medications   furosemide (LASIX) 20 MG tablet      Gently cleanse the areas in the shower do not scrub or peel skin.  Apply a nice coating of Vaseline or Aquaphor to moisturize the area okay to use Ace wrap's on top for compression since putting on the compression stockings as part of what actually caused the initial trauma will try to avoid those  for now.  Okay to use a diuretic as needed I did update the prescription.  Call if any problems concerns or not healing after the weekend.  Return if symptoms worsen or fail to improve.    [name], MD

## 2023-03-27 NOTE — Assessment & Plan Note (Signed)
He did have his diabetic eye exam performed at the [facility] yesterday which did show some mild retinopathy.  Will get the chart updated and get the exam note scanned in.

## 2023-04-24 ENCOUNTER — Encounter: Payer: Self-pay | Admitting: Family Medicine

## 2023-05-16 ENCOUNTER — Other Ambulatory Visit: Payer: Self-pay | Admitting: Family Medicine

## 2023-05-18 ENCOUNTER — Other Ambulatory Visit: Payer: Self-pay | Admitting: *Deleted

## 2023-05-21 ENCOUNTER — Encounter: Payer: Self-pay | Admitting: Family Medicine

## 2023-05-21 ENCOUNTER — Ambulatory Visit (INDEPENDENT_AMBULATORY_CARE_PROVIDER_SITE_OTHER): Payer: PPO | Admitting: Family Medicine

## 2023-05-21 VITALS — BP 109/63 | HR 84 | Ht 74.0 in | Wt 274.0 lb

## 2023-05-21 DIAGNOSIS — I872 Venous insufficiency (chronic) (peripheral): Secondary | ICD-10-CM

## 2023-05-21 DIAGNOSIS — Z7985 Long-term (current) use of injectable non-insulin antidiabetic drugs: Secondary | ICD-10-CM

## 2023-05-21 DIAGNOSIS — I10 Essential (primary) hypertension: Secondary | ICD-10-CM

## 2023-05-21 DIAGNOSIS — E1149 Type 2 diabetes mellitus with other diabetic neurological complication: Secondary | ICD-10-CM

## 2023-05-21 LAB — POCT GLYCOSYLATED HEMOGLOBIN (HGB A1C): Hemoglobin A1C: 7.1 % — AB (ref 4.0–5.6)

## 2023-05-21 MED ORDER — CIPROFLOXACIN HCL 500 MG PO TABS: 500.0000 mg | ORAL_TABLET | Freq: Two times a day (BID) | ORAL | 6 refills | Status: DC

## 2023-05-21 NOTE — Assessment & Plan Note (Signed)
A1c already looks better today at 7.1.  He is on 0.5 mg as Ozempic he has a follow-up with the pharmacist in about 2 weeks and then most likely increase his dose to 1 mg at that point in time.  He is down another 3 pounds which is fantastic.  BMI 35.  Continue to work on portion control, healthy food choices and regular exercise.

## 2023-05-21 NOTE — Assessment & Plan Note (Signed)
Blood pressure is a little on the lower end today so we did discuss that if he continues to lose weight on the Ozempic that his blood pressures might gradually decrease and he needs to be on the look out for low blood pressures.  His lisinopril can always be adjusted down if needed he is currently taking 20 mg daily.

## 2023-05-21 NOTE — Progress Notes (Signed)
Established Patient Office Visit  Subjective   Patient ID: Brian Strickland, male    DOB: 05-Apr-1946  Age: 77 y.o. MRN: [id number]  Chief Complaint  Patient presents with   Diabetes    HPI  Chronic UTIs -feels like he had a UTI last week and use his medication he tends to keep a prescription on hand to use as needed. He hadn't had a UTI in a long time.  He is currenly on Ozempic through the [place].      Diabetes - no hypoglycemic events. No wounds or sores that are not healing well. No increased thirst or urination. Checking glucose at home. Taking medications as prescribed without any side effects.  Currently on Ozempic instead of the Rybelsus.  He is working with the pharmacist through the [place].  His legs are really doing okay has not had any recent or abrupt swelling.  He is no longer using compression stockings because that is what was causing the injury and tears to the skin.  He is keeping them well moisturized.    ROS    Objective:     BP 109/63   Pulse 84   Ht 6\' 2"  (1.88 m)   Wt 274 lb (124.3 kg)   SpO2 100%   BMI 35.18 kg/m    Physical Exam Constitutional:      Appearance: He is well-developed.  HENT:     Head: Normocephalic and atraumatic.  Cardiovascular:     Rate and Rhythm: Normal rate and regular rhythm.     Heart sounds: Normal heart sounds.  Pulmonary:     Effort: Pulmonary effort is normal.     Breath sounds: Normal breath sounds.  Musculoskeletal:     Comments: He has a dusky erythematous appearance to his lower extremities  Skin:    General: Skin is warm and dry.  Neurological:     Mental Status: He is alert and oriented to person, place, and time.  Psychiatric:        Behavior: Behavior normal.      Results for orders placed or performed in visit on 05/21/23  POCT glycosylated hemoglobin (Hb A1C)  Result Value Ref Range   Hemoglobin A1C 7.1 (A) 4.0 - 5.6 %   HbA1c POC (<> result, manual entry)     HbA1c, POC (prediabetic range)      HbA1c, POC (controlled diabetic range)        The 10-year ASCVD risk score (Arnett DK, et al., 2019) is: 43.2%    Assessment & Plan:   Problem List Items Addressed This Visit       Cardiovascular and Mediastinum   HYPERTENSION, BENIGN SYSTEMIC    Blood pressure is a little on the lower end today so we did discuss that if he continues to lose weight on the Ozempic that his blood pressures might gradually decrease and he needs to be on the look out for low blood pressures.  His lisinopril can always be adjusted down if needed he is currently taking 20 mg daily.        Endocrine   Type 2 diabetes mellitus with neurological complications (HCC) - Primary    A1c already looks better today at 7.1.  He is on 0.5 mg as Ozempic he has a follow-up with the pharmacist in about 2 weeks and then most likely increase his dose to 1 mg at that point in time.  He is down another 3 pounds which is fantastic.  BMI 35.  Continue to work on portion control, healthy food choices and regular exercise.      Relevant Medications   Semaglutide,0.25 or 0.5MG /DOS, 2 MG/3ML SOPN   Other Relevant Orders   POCT glycosylated hemoglobin (Hb A1C) (Completed)   Other Visit Diagnoses     Stasis dermatitis of both legs           Return in about 4 months (around 09/21/2023) for Diabetes follow-up.   I spent 40 minutes on the day of the encounter to include pre-visit record review, face-to-face time with the patient and post visit ordering of test.  [name], MD

## 2023-05-22 NOTE — Addendum Note (Signed)
Addended by: [name] D on: 05/22/2023 10:19 AM   Modules accepted: Level of Service

## 2023-07-30 ENCOUNTER — Encounter: Payer: Self-pay | Admitting: Family Medicine

## 2023-09-22 ENCOUNTER — Ambulatory Visit (INDEPENDENT_AMBULATORY_CARE_PROVIDER_SITE_OTHER): Payer: PPO | Admitting: Family Medicine

## 2023-09-22 ENCOUNTER — Encounter: Payer: Self-pay | Admitting: Family Medicine

## 2023-09-22 VITALS — BP 107/44 | HR 76 | Ht 74.0 in | Wt 273.0 lb

## 2023-09-22 DIAGNOSIS — I10 Essential (primary) hypertension: Secondary | ICD-10-CM

## 2023-09-22 DIAGNOSIS — N1832 Chronic kidney disease, stage 3b: Secondary | ICD-10-CM

## 2023-09-22 DIAGNOSIS — Z7985 Long-term (current) use of injectable non-insulin antidiabetic drugs: Secondary | ICD-10-CM | POA: Diagnosis not present

## 2023-09-22 DIAGNOSIS — I2510 Atherosclerotic heart disease of native coronary artery without angina pectoris: Secondary | ICD-10-CM | POA: Diagnosis not present

## 2023-09-22 DIAGNOSIS — Z6835 Body mass index (BMI) 35.0-35.9, adult: Secondary | ICD-10-CM | POA: Diagnosis not present

## 2023-09-22 DIAGNOSIS — E1121 Type 2 diabetes mellitus with diabetic nephropathy: Secondary | ICD-10-CM

## 2023-09-22 DIAGNOSIS — E1149 Type 2 diabetes mellitus with other diabetic neurological complication: Secondary | ICD-10-CM

## 2023-09-22 DIAGNOSIS — E113292 Type 2 diabetes mellitus with mild nonproliferative diabetic retinopathy without macular edema, left eye: Secondary | ICD-10-CM

## 2023-09-22 DIAGNOSIS — D539 Nutritional anemia, unspecified: Secondary | ICD-10-CM | POA: Diagnosis not present

## 2023-09-22 LAB — POCT UA - MICROALBUMIN
Albumin/Creatinine Ratio, Urine, POC: <30
Creatinine, POC: 300 mg/dL
Microalbumin Ur, POC: 10 mg/L

## 2023-09-22 LAB — POCT GLYCOSYLATED HEMOGLOBIN (HGB A1C): Hemoglobin A1C: 7.2 % — AB (ref 4.0–5.6)

## 2023-09-22 MED ORDER — LISINOPRIL 10 MG PO TABS: 10.0000 mg | ORAL_TABLET | Freq: Every day | ORAL | 1 refills | Status: AC

## 2023-09-22 NOTE — Assessment & Plan Note (Signed)
Recheck every 6 months. Urine micro performed today.

## 2023-09-22 NOTE — Assessment & Plan Note (Signed)
Last 35.  Weight is down slightly which is great.  Just continue to work on healthy food choices and staying as active as possible.

## 2023-09-22 NOTE — Assessment & Plan Note (Signed)
Continue pravastatin.  Will get updated labs today.

## 2023-09-22 NOTE — Assessment & Plan Note (Addendum)
Bp low again today. Will cut lisinopril in half.  Plan to check blood pressures at home if they still look great then [facility] and can get a 90-day supply from their office or here if he prefers.

## 2023-09-22 NOTE — Assessment & Plan Note (Signed)
Lisinopril.  Though I am decreasing his dose because of low blood pressures to 10 mg.  He uses the Lasix infrequently.

## 2023-09-22 NOTE — Assessment & Plan Note (Signed)
On track with yearly eye exam.

## 2023-09-22 NOTE — Progress Notes (Signed)
Established Patient Office Visit  Subjective   Patient ID: Brian Strickland, male    DOB: Feb 04, 1946  Age: 77 y.o. MRN: [id number]  Chief Complaint  Patient presents with   Diabetes    HPI   Diabetes - no hypoglycemic events. No wounds or sores that are not healing well. No increased thirst or urination. Checking glucose at home. Taking medications as prescribed without any side effects.  Up to 1 mg once weekly.  He has follow-up with the clinical pharmacist in a couple of weeks at the [place].  He is up to 1 mg he is only been on that dose for about a month.  He still using his long-acting and short acting Lantus long-acting uses about 18 units he has not really had any lows.  Lows blood sugars been around 90.  He still just using the furosemide as needed.     ROS    Objective:     BP (!) 107/44   Pulse 76   Ht 6\' 2"  (1.88 m)   Wt 273 lb (123.8 kg)   SpO2 99%   BMI 35.05 kg/m    Physical Exam Vitals and nursing note reviewed.  Constitutional:      Appearance: Normal appearance.  HENT:     Head: Normocephalic and atraumatic.  Eyes:     Conjunctiva/sclera: Conjunctivae normal.  Cardiovascular:     Rate and Rhythm: Normal rate and regular rhythm.  Pulmonary:     Effort: Pulmonary effort is normal.     Breath sounds: Normal breath sounds.  Skin:    General: Skin is warm and dry.     Comments: Have a chronic purple discoloration of both lower extremities with trace edema.  Neurological:     Mental Status: He is alert.  Psychiatric:        Mood and Affect: Mood normal.      Results for orders placed or performed in visit on 09/22/23  POCT HgB A1C  Result Value Ref Range   Hemoglobin A1C 7.2 (A) 4.0 - 5.6 %   HbA1c POC (<> result, manual entry)     HbA1c, POC (prediabetic range)     HbA1c, POC (controlled diabetic range)    POCT UA - Microalbumin  Result Value Ref Range   Microalbumin Ur, POC 10 mg/L   Creatinine, POC 300 mg/dL   Albumin/Creatinine Ratio,  Urine, POC <30       The 10-year ASCVD risk score (Arnett DK, et al., 2019) is: 44.9%    Assessment & Plan:   Problem List Items Addressed This Visit       Cardiovascular and Mediastinum   HYPERTENSION, BENIGN SYSTEMIC    Bp low again today. Will cut lisinopril in half.  Plan to check blood pressures at home if they still look great then [facility] and can get a 90-day supply from their office or here if he prefers.      Relevant Medications   lisinopril (ZESTRIL) 10 MG tablet   Other Relevant Orders   CMP14+EGFR   Lipid panel   CBC   Coronary atherosclerosis    Continue pravastatin.  Will get updated labs today.      Relevant Medications   lisinopril (ZESTRIL) 10 MG tablet     Endocrine   Type 2 diabetes mellitus with neurological complications (HCC) - Primary   Relevant Medications   Semaglutide, 1 MG/DOSE, 4 MG/3ML SOPN   lisinopril (ZESTRIL) 10 MG tablet  Other Relevant Orders   POCT HgB A1C (Completed)   CMP14+EGFR   Lipid panel   CBC   POCT UA - Microalbumin (Completed)   Diabetic retinopathy (HCC)    On track with yearly eye exam.      Relevant Medications   Semaglutide, 1 MG/DOSE, 4 MG/3ML SOPN   lisinopril (ZESTRIL) 10 MG tablet   Diabetic nephropathy with proteinuria (HCC)    Lisinopril.  Though I am decreasing his dose because of low blood pressures to 10 mg.  He uses the Lasix infrequently.      Relevant Medications   Semaglutide, 1 MG/DOSE, 4 MG/3ML SOPN   lisinopril (ZESTRIL) 10 MG tablet     Genitourinary   Chronic kidney disease (CKD) stage G3b/A2, moderately decreased glomerular filtration rate (GFR) between 30-44 mL/min/1.73 square meter and albuminuria creatinine ratio between 30-299 mg/g (HCC)    Recheck every 6 months. Urine micro performed today.      Relevant Orders   CMP14+EGFR   Lipid panel   CBC     Other   Severe obesity (BMI 35.0-35.9 with comorbidity) (HCC)    Last 35.  Weight is down slightly which is  great.  Just continue to work on healthy food choices and staying as active as possible.      Relevant Medications   Semaglutide, 1 MG/DOSE, 4 MG/3ML SOPN    Return in about 3 months (around 12/30/2023).    [name], MD

## 2023-09-23 LAB — LIPID PANEL
Chol/HDL Ratio: 3.8 ratio (ref 0.0–5.0)
Cholesterol, Total: 167 mg/dL (ref 100–199)
HDL: 44 mg/dL (ref >39)
LDL Chol Calc (NIH): 82 mg/dL (ref 0–99)
Triglycerides: 250 mg/dL — ABNORMAL HIGH (ref 0–149)
VLDL Cholesterol Cal: 41 mg/dL — ABNORMAL HIGH (ref 5–40)

## 2023-09-23 LAB — CMP14+EGFR
ALT: 21 [IU]/L (ref 0–44)
AST: 22 [IU]/L (ref 0–40)
Albumin: 4.3 g/dL (ref 3.8–4.8)
Alkaline Phosphatase: 64 [IU]/L (ref 44–121)
BUN/Creatinine Ratio: 17 (ref 10–24)
BUN: 23 mg/dL (ref 8–27)
Bilirubin Total: 0.4 mg/dL (ref 0.0–1.2)
CO2: 23 mmol/L (ref 20–29)
Calcium: 9.4 mg/dL (ref 8.6–10.2)
Chloride: 101 mmol/L (ref 96–106)
Creatinine, Ser: 1.37 mg/dL — ABNORMAL HIGH (ref 0.76–1.27)
Globulin, Total: 2.9 g/dL (ref 1.5–4.5)
Glucose: 164 mg/dL — ABNORMAL HIGH (ref 70–99)
Potassium: 4.6 mmol/L (ref 3.5–5.2)
Sodium: 139 mmol/L (ref 134–144)
Total Protein: 7.2 g/dL (ref 6.0–8.5)
eGFR: 53 mL/min/{1.73_m2} — ABNORMAL LOW (ref >59)

## 2023-09-23 LAB — CBC
Hematocrit: 47.8 % (ref 37.5–51.0)
Hemoglobin: 15.8 g/dL (ref 13.0–17.7)
MCH: 33.3 pg — ABNORMAL HIGH (ref 26.6–33.0)
MCHC: 33.1 g/dL (ref 31.5–35.7)
MCV: 101 fL — ABNORMAL HIGH (ref 79–97)
Platelets: 229 10*3/uL (ref 150–450)
RBC: 4.75 x10E6/uL (ref 4.14–5.80)
RDW: 11.9 % (ref 11.6–15.4)
WBC: 6.4 10*3/uL (ref 3.4–10.8)

## 2023-09-23 NOTE — Progress Notes (Signed)
Hi Alter, kidney function looks little better this time at 1.3 so I think it still at your baseline.  Liver enzymes look good.  Triglycerides are up a little bit compared to the last time.  But LDL was right at 82 so just continue to work on healthy diet and regular exercise.  Your hemoglobin looks normal but the red blood cells are little larger than I would expect someone call the lab and see if we can add a B12 and a folate.  Please call lab and see if we can add a B12 and a folate.  Red blood cells are macrocytic.

## 2023-09-24 LAB — B12 AND FOLATE PANEL
Folate: 7.7 ng/mL (ref >3.0)
Vitamin B-12: 593 pg/mL (ref 232–1245)

## 2023-09-24 LAB — SPECIMEN STATUS REPORT

## 2023-09-24 NOTE — Progress Notes (Signed)
Hi Rollie, your B12 and folate are both normal which is great.  I just wanted to make sure you are experiencing a deficiency.

## 2023-10-20 ENCOUNTER — Other Ambulatory Visit: Payer: Self-pay | Admitting: Family Medicine

## 2023-12-24 ENCOUNTER — Ambulatory Visit: Payer: PPO | Admitting: Family Medicine

## 2023-12-30 ENCOUNTER — Encounter: Payer: Self-pay | Admitting: Family Medicine

## 2024-01-11 LAB — HEMOGLOBIN A1C: Hemoglobin A1C: 7

## 2024-01-14 DIAGNOSIS — C61 Malignant neoplasm of prostate: Secondary | ICD-10-CM | POA: Diagnosis not present

## 2024-02-22 ENCOUNTER — Encounter: Payer: Self-pay | Admitting: Family Medicine

## 2024-02-22 MED ORDER — OSELTAMIVIR PHOSPHATE 75 MG PO CAPS: 75.0000 mg | ORAL_CAPSULE | Freq: Every day | ORAL | 0 refills | Status: DC

## 2024-05-11 LAB — HM DIABETES EYE EXAM

## 2024-06-03 LAB — LAB REPORT - SCANNED
A1c: 7.1
Creatinine, POC: 136.4 mg/dL
EGFR: 39
Microalb Creat Ratio: 10.3
Microalbumin, Urine: 1.4

## 2024-07-20 ENCOUNTER — Other Ambulatory Visit: Payer: Self-pay | Admitting: Family Medicine

## 2024-08-25 ENCOUNTER — Ambulatory Visit
Admission: EM | Admit: 2024-08-25 | Discharge: 2024-08-25 | Disposition: A | Discharge status: Home / Self Care | Source: Ambulatory Visit | Attending: Family Medicine | Admitting: Family Medicine

## 2024-08-25 ENCOUNTER — Ambulatory Visit

## 2024-08-25 ENCOUNTER — Other Ambulatory Visit: Payer: Self-pay

## 2024-08-25 DIAGNOSIS — I517 Cardiomegaly: Secondary | ICD-10-CM

## 2024-08-25 DIAGNOSIS — R0602 Shortness of breath: Secondary | ICD-10-CM

## 2024-08-25 HISTORY — DX: Malignant neoplasm of bladder, unspecified: C67.9

## 2024-08-25 HISTORY — DX: Malignant neoplasm of prostate: C61

## 2024-08-25 NOTE — Discharge Instructions (Signed)
 No evidence of pneumonia or respiratory infection Consider a course of Allegra for the sneezing I think the fatigue will improve over time, and is likely due to your cancer treatment It was good to see you again

## 2024-08-25 NOTE — ED Triage Notes (Signed)
 Has been feeling unwell x few weeks. Has had cough and sob. Wife has pna. Pt wants to r/o pna. Had slight fever yesterday per pt. No otc meds.

## 2024-08-25 NOTE — ED Provider Notes (Signed)
 Brian Strickland    CSN: [id number] Arrival date & time: 08/25/24  0804      History   Chief Complaint Chief Complaint  Patient presents with   Cough    HPI Brian Strickland is a 78 y.o. male.   Patient is known to me from prior visit.  This is a medically complex patient.  He has just completed treatment for bladder cancer and prostate cancer.  Remote history of colon cancer.  He has diabetes, hypertension, hyperlipidemia, and coronary artery disease.  Non-smoker, no lung disease or emphysema.  He has felt unwell for a couple of months.  He thinks it is from his cancer treatment.  Recently has had shortness of breath, sneezing, coughing, and sinus congestion.  His wife is in the hospital with a pneumonia.  He is running home from the hospital tomorrow or the next day and wants to make sure he does not have a contagious illness, and wants to make sure he does not have pneumonia as well.  No fever or chills.  Complains of fatigue, but thinks this is from his cancer treatment (leuprolide injections)    Past Medical History:  Diagnosis Date   Bladder cancer (HCC)    CAD (coronary artery disease)    colon ca dx'd 1998/ 2006   colon/recurrent   Diabetes mellitus    Type 2   Hypertension    Peripheral neuropathy    secondary to DM and chemotherapy   Prostate cancer (HCC)    Urethral meatal stenosis    S/P dilatation (Dr. Polly)    Patient Active Problem List   Diagnosis Date Noted   Diabetic retinopathy (HCC) 03/27/2023   Combined forms of age-related cataract, bilateral 09/19/2022   Exposure to Agent [facility] 09/19/2022   Anxiety disorder 03/06/2021   Bilateral inguinal hernia 08/31/2017   Recurrent UTI 02/20/2015   Chronic kidney disease (CKD) stage G3b/A2, moderately decreased glomerular filtration rate (GFR) between 30-44 mL/min/1.73 square meter and albuminuria creatinine ratio between 30-299 mg/g (HCC) 05/19/2014   Diabetic nephropathy with proteinuria (HCC)  11/14/2013   History of colon cancer 05/25/2013   Severe obesity (BMI 35.0-35.9 with comorbidity) (HCC) 12/17/2011   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   Iron deficiency anemia 09/02/2010   LUMBAGO 10/01/2007   HYPERCHOLESTEROLEMIA 08/18/2006   PERNICIOUS ANEMIA 08/18/2006   ERECTILE DYSFUNCTION 08/18/2006   Hereditary and idiopathic peripheral neuropathy 08/18/2006   HYPERTENSION, BENIGN SYSTEMIC 08/18/2006   Coronary atherosclerosis 08/18/2006    Past Surgical History:  Procedure Laterality Date   cholectomy     colon cancer  09/10/1997   recurrence   CORONARY ARTERY BYPASS GRAFT     INSERTION PROSTATE RADIATION SEED     REFRACTIVE SURGERY         Home Medications    Prior to Admission medications   Medication Sig Start Date End Date Taking? Authorizing Provider  leuprolide, 6 Month, (ELIGARD) 45 MG injection Inject 45 mg into the skin every 6 (six) months.   Yes [provider]  AMBULATORY NON FORMULARY MEDICATION Glucometer of patient and insurance choice.  Diagnosis: Diabetes 01/06/13   Alvan Dorothyann BIRCH, MD  aspirin 81 MG tablet Take 81 mg by mouth daily.      [provider]  clobetasol  ointment (TEMOVATE ) 0.05 % Apply 1 application topically 2 (two) times daily. Give skin a break after 2 weeks. 08/30/21   Alvan Dorothyann BIRCH, MD  diclofenac  sodium (VOLTAREN ) 1 % GEL APPLY  TOPICALLY 4 (FOUR) TIMES DAILY. 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    [provider]  furosemide  (LASIX ) 20 MG tablet Take 1-2 tablets (20-40 mg total) by mouth daily as needed. 10/21/23   Alvan Dorothyann BIRCH, MD  insulin  aspart (NOVOLOG  FLEXPEN) 100 UNIT/ML injection SSI TID with Meals. 12/28/18   Alvan Dorothyann BIRCH, MD  insulin  glargine (LANTUS) 100 UNIT/ML injection Inject 18-20 Units into the skin 2 (two) times daily.     [provider]  lisinopril  (ZESTRIL ) 10 MG tablet Take 1 tablet (10 mg total) by mouth daily. 09/22/23   Alvan Dorothyann BIRCH, MD  pravastatin  (PRAVACHOL ) 40 MG tablet Take 1 tablet (40 mg total) by mouth at bedtime. 08/20/20   Alvan Dorothyann BIRCH, MD  Semaglutide , 1 MG/DOSE, 4 MG/3ML SOPN Inject 1 mg into the skin once a week. 08/14/23   [provider]  [facility] syringe USE AS DIRECTED 07/21/24   Alvan Dorothyann BIRCH, MD    Family History Family History  Problem Relation Age of Onset   Cancer Other        colon/family history    Social History Social History   Tobacco Use   Smoking status: Never   Smokeless tobacco: Never  Substance Use Topics   Alcohol use: Not Currently    Comment: Socially   Drug use: No     Allergies   Morphine, Lyrica [pregabalin], Neurontin [gabapentin], Atorvastatin, Carbamazepine, Invokana  [canagliflozin ], Metformin  and related, Nortriptyline hcl, Oxycodone hcl, Empagliflozin , Latex, and Neomycin-polymyxin-dexameth   Review of Systems Review of Systems  See HPI Physical Exam Triage Vital Signs ED Triage Vitals  Encounter Vitals Group     BP 08/25/24 0820 (!) 151/75     Girls Systolic BP Percentile --      Girls Diastolic BP Percentile --      Boys Systolic BP Percentile --      Boys Diastolic BP Percentile --      Pulse Rate 08/25/24 0820 86     Resp 08/25/24 0820 (!) 22     Temp 08/25/24 0820 98 F (36.7 C)     Temp src --      SpO2 08/25/24 0820 95 %     Weight --      Height --      Head Circumference --      Peak Flow --      Pain Score 08/25/24 0824 0     Pain Loc --      Pain Education --      Exclude from Growth Chart --      Updated Vital Signs BP (!) 151/75   Pulse 86   Temp 98 F (36.7 C)   Resp (!) 22   SpO2 95%    Physical Exam Constitutional:      General: He is not in acute distress.    Appearance: He is well-developed. He is obese. He is ill-appearing.  HENT:     Head:  Normocephalic and atraumatic.     Mouth/Throat:     Mouth: Mucous membranes are moist.     Pharynx: No posterior oropharyngeal erythema.  Eyes:     Conjunctiva/sclera: Conjunctivae normal.     Pupils: Pupils are equal, round, and reactive to light.  Cardiovascular:     Rate and Rhythm: Normal rate and regular rhythm.     Heart sounds: Normal heart sounds.  Pulmonary:     Effort: Pulmonary effort is normal. No respiratory distress.     Breath sounds: No wheezing, rhonchi or rales.     Comments: Diminished breath sounds left greater than right Abdominal:     General: There is no distension.     Palpations: Abdomen is soft.  Musculoskeletal:        General: Normal range of motion.     Cervical back: Normal range of motion.  Skin:    General: Skin is warm and dry.  Neurological:     Mental Status: He is alert.      UC Treatments / Results  Labs (all labs ordered are listed, but only abnormal results are displayed) Labs Reviewed - No data to display  EKG   Radiology DG Chest 2 View Result Date: 08/25/2024 CLINICAL DATA:  Shortness of breath. EXAM: CHEST - 2 VIEW COMPARISON:  05/29/2009 FINDINGS: The cardio pericardial silhouette is enlarged. Stable fat density overlying the medial left lung and left hilum compatible with prominent anterior mediastinal fat as seen on CT 11/17/2014. The lungs are clear without focal pneumonia, edema, pneumothorax or pleural effusion. No acute bony abnormality. IMPRESSION: No active cardiopulmonary disease. Electronically Signed   By: Camellia Candle M.D.   On: 08/25/2024 08:52    Procedures Procedures (including critical Strickland time)  Medications Ordered in UC Medications - No data to display  Initial Impression / Assessment and Plan / UC Course  I have reviewed the triage vital signs and the nursing notes.  Pertinent labs & imaging results that were available during my Strickland of the patient were reviewed by me and considered in my medical  decision making (see chart for details).     I feel that the patient is probably right that his fatigue is from his cancer treatment.  The sneezing and respiratory symptoms are likely allergies.  There is no sign of infection.  Patient is reassured Final Clinical Impressions(s) / UC Diagnoses   Final diagnoses:  SOB (shortness of breath)  Cardiomegaly     Discharge Instructions      No evidence of pneumonia or respiratory infection Consider a course of Allegra for the sneezing I think the fatigue will improve over time, and is likely due to your cancer treatment It was good to see you again     ED Prescriptions   None    PDMP not reviewed this encounter.   Maranda Jamee Jacob, MD 08/25/24 1210

## 2024-08-26 ENCOUNTER — Telehealth: Payer: Self-pay

## 2024-08-26 NOTE — Telephone Encounter (Signed)
 Called per patient call back orders. Pt reports he is doing well. Wife is still in hospital, informed patient to call back for any questions and concerns

## 2024-09-05 ENCOUNTER — Other Ambulatory Visit: Payer: Self-pay | Admitting: Family Medicine

## 2024-09-08 ENCOUNTER — Encounter: Payer: Self-pay | Admitting: Family Medicine

## 2024-09-08 NOTE — Progress Notes (Signed)
 Brian Strickland                                          MRN: [id number]   09/08/2024   The [facility] Quality Team Specialist reviewed this patient medical record for the purposes of chart review for care gap closure. The following were reviewed: chart review for care gap closure-controlling blood pressure.    [facility] Quality Team

## 2024-09-09 MED ORDER — CIPROFLOXACIN HCL 500 MG PO TABS: 500.0000 mg | ORAL_TABLET | Freq: Two times a day (BID) | ORAL | 6 refills | Status: AC

## 2024-09-09 NOTE — Telephone Encounter (Signed)
 Meds ordered this encounter  Medications   ciprofloxacin  (CIPRO ) 500 MG tablet    Sig: Take 1 tablet (500 mg total) by mouth 2 (two) times daily. For uncomplicated UTI's    Dispense:  14 tablet    Refill:  6

## 2024-09-09 NOTE — Addendum Note (Signed)
 Addended by: Doni Widmer D on: 09/09/2024 11:40 AM   Modules accepted: Orders

## 2024-09-27 ENCOUNTER — Ambulatory Visit
Admission: EM | Admit: 2024-09-27 | Discharge: 2024-09-27 | Disposition: A | Discharge status: Home / Self Care | Attending: Family Medicine | Admitting: Family Medicine

## 2024-09-27 ENCOUNTER — Encounter: Payer: Self-pay | Admitting: *Deleted

## 2024-09-27 DIAGNOSIS — R399 Unspecified symptoms and signs involving the genitourinary system: Secondary | ICD-10-CM | POA: Diagnosis not present

## 2024-09-27 DIAGNOSIS — N390 Urinary tract infection, site not specified: Secondary | ICD-10-CM | POA: Diagnosis not present

## 2024-09-27 LAB — POCT URINALYSIS DIP (MANUAL ENTRY)
Bilirubin, UA: NEGATIVE
Blood, UA: NEGATIVE
Glucose, UA: NEGATIVE mg/dL
Ketones, POC UA: NEGATIVE mg/dL
Leukocytes, UA: NEGATIVE
Nitrite, UA: NEGATIVE
Protein Ur, POC: NEGATIVE mg/dL
Spec Grav, UA: >=1.03 — AB (ref 1.010–1.025)
Urobilinogen, UA: 0.2 U/dL
pH, UA: 5.5 (ref 5.0–8.0)

## 2024-09-27 MED ORDER — CEPHALEXIN 500 MG PO CAPS: 500.0000 mg | ORAL_CAPSULE | Freq: Two times a day (BID) | ORAL | 0 refills | Status: AC

## 2024-09-27 NOTE — ED Triage Notes (Signed)
 Patient states 10 days of dysuria and urgency.  He was treated at the end of Oct for UTI symptoms but they didn't resolve, did not do a UA at that time med was just called in.  Cipro  normally works but did not at that time

## 2024-09-27 NOTE — ED Provider Notes (Signed)
 [facility] Strickland CARE    CSN: [id number] Arrival date & time: 09/27/24  1610      History   Chief Complaint Chief Complaint  Patient presents with   Dysuria    HPI Brian Strickland is a 78 y.o. male.   Brian Strickland is known to me from prior visits.  He has had a lot of problems with recurring urinary tract infections.  He has had bladder cancer and prostate cancer.  He had radiation treatment and is currently on hormone therapy.  He is here for lower urinary tract symptoms.  Frequency and urgency and dysuria.  He states that he has had these for a couple of weeks.  He took a course of Cipro  prescribed by his PCP.  This is always worked for him in the past.  This time it did not.  He wonders why he is having symptoms in spite of antibiotics.    Past Medical History:  Diagnosis Date   Bladder cancer (HCC)    CAD (coronary artery disease)    colon ca dx'd 1998/ 2006   colon/recurrent   Diabetes mellitus    Type 2   Hypertension    Peripheral neuropathy    secondary to DM and chemotherapy   Prostate cancer (HCC)    Urethral meatal stenosis    S/P dilatation (Dr. Polly)    Patient Active Problem List   Diagnosis Date Noted   Diabetic retinopathy (HCC) 03/27/2023   Combined forms of age-related cataract, bilateral 09/19/2022   Exposure to Agent [facility] 09/19/2022   Anxiety disorder 03/06/2021   Bilateral inguinal hernia 08/31/2017   Recurrent UTI 02/20/2015   Chronic kidney disease (CKD) stage G3b/A2, moderately decreased glomerular filtration rate (GFR) between 30-44 mL/min/1.73 square meter and albuminuria creatinine ratio between 30-299 mg/g (HCC) 05/19/2014   Diabetic nephropathy with proteinuria (HCC) 11/14/2013   History of colon cancer 05/25/2013   Severe obesity (BMI 35.0-35.9 with comorbidity) (HCC) 12/17/2011   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   Iron deficiency anemia 09/02/2010   LUMBAGO 10/01/2007    HYPERCHOLESTEROLEMIA 08/18/2006   PERNICIOUS ANEMIA 08/18/2006   ERECTILE DYSFUNCTION 08/18/2006   Hereditary and idiopathic peripheral neuropathy 08/18/2006   HYPERTENSION, BENIGN SYSTEMIC 08/18/2006   Coronary atherosclerosis 08/18/2006    Past Surgical History:  Procedure Laterality Date   cholectomy     colon cancer  09/10/1997   recurrence   CORONARY ARTERY BYPASS GRAFT     INSERTION PROSTATE RADIATION SEED     REFRACTIVE SURGERY         Home Medications    Prior to Admission medications   Medication Sig Start Date End Date Taking? Authorizing Provider  cephALEXin (KEFLEX) 500 MG capsule Take 1 capsule (500 mg total) by mouth 2 (two) times daily. 09/27/24  Yes Maranda Jamee Jacob, MD  AMBULATORY NON FORMULARY MEDICATION Glucometer of patient and insurance choice.  Diagnosis: Diabetes 01/06/13   Alvan Dorothyann BIRCH, MD  aspirin 81 MG tablet Take 81 mg by mouth daily.      [provider]  ciprofloxacin  (CIPRO ) 500 MG tablet Take 1 tablet (500 mg total) by mouth 2 (two) times daily. For uncomplicated UTI's 09/09/24   Alvan Dorothyann BIRCH, MD  clobetasol  ointment (TEMOVATE ) 0.05 % Apply 1 application topically 2 (two) times daily. Give skin a break after 2 weeks. 08/30/21   Alvan Dorothyann BIRCH, MD  diclofenac  sodium (VOLTAREN ) 1 % GEL APPLY TOPICALLY 4 (FOUR) TIMES DAILY. 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    [provider]  furosemide  (LASIX ) 20 MG tablet Take 1-2 tablets (20-40 mg total) by mouth daily as needed. 10/21/23   Alvan Dorothyann BIRCH, MD  insulin  aspart (NOVOLOG  FLEXPEN) 100 UNIT/ML injection SSI TID with Meals. 12/28/18   Alvan Dorothyann BIRCH, MD  insulin  glargine (LANTUS) 100 UNIT/ML injection Inject 18-20 Units into the skin 2 (two) times daily.    [provider]  leuprolide, 6 Month, (ELIGARD) 45 MG injection Inject 45 mg  into the skin every 6 (six) months.    [provider]  lisinopril  (ZESTRIL ) 10 MG tablet Take 1 tablet (10 mg total) by mouth daily. 09/22/23   Alvan Dorothyann BIRCH, MD  pravastatin  (PRAVACHOL ) 40 MG tablet Take 1 tablet (40 mg total) by mouth at bedtime. 08/20/20   Alvan Dorothyann BIRCH, MD  Semaglutide , 1 MG/DOSE, 4 MG/3ML SOPN Inject 1 mg into the skin once a week. 08/14/23   [provider]  [facility] syringe USE AS DIRECTED 07/21/24   Alvan Dorothyann BIRCH, MD    Family History Family History  Problem Relation Age of Onset   Cancer Other        colon/family history    Social History Social History   Tobacco Use   Smoking status: Never   Smokeless tobacco: Never  Vaping Use   Vaping status: Never Used  Substance Use Topics   Alcohol use: Not Currently    Comment: Socially   Drug use: No     Allergies   Morphine, Lyrica [pregabalin], Neurontin [gabapentin], Atorvastatin, Carbamazepine, Invokana  [canagliflozin ], Metformin  and related, Nortriptyline hcl, Oxycodone hcl, Empagliflozin , Latex, and Neomycin-polymyxin-dexameth   Review of Systems Review of Systems See HPI  Physical Exam Triage Vital Signs ED Triage Vitals  Encounter Vitals Group     BP 09/27/24 1637 138/62     Girls Systolic BP Percentile --      Girls Diastolic BP Percentile --      Boys Systolic BP Percentile --      Boys Diastolic BP Percentile --      Pulse Rate 09/27/24 1637 71     Resp 09/27/24 1637 20     Temp 09/27/24 1637 98.2 F (36.8 C)     Temp Source 09/27/24 1637 Oral     SpO2 09/27/24 1637 97 %     Weight 09/27/24 1638 260 lb (117.9 kg)     Height --      Head Circumference --      Peak Flow --      Pain Score 09/27/24 1638 0     Pain Loc --      Pain Education --      Exclude from Growth Chart --    No data found.  Updated Vital Signs BP 138/62 (BP Location: Left Arm)   Pulse 71   Temp 98.2 F (36.8 C) (Oral)   Resp 20   Wt 117.9 kg   SpO2 97%   BMI  33.38 kg/m       Physical Exam Constitutional:      General: He is not in acute distress.    Appearance: He is well-developed. He is obese.  HENT:     Head: Normocephalic and atraumatic.  Eyes:     Conjunctiva/sclera: Conjunctivae normal.     Pupils: Pupils are equal, round, and reactive to light.  Cardiovascular:  Rate and Rhythm: Normal rate.  Pulmonary:     Effort: Pulmonary effort is normal. No respiratory distress.  Musculoskeletal:        General: Normal range of motion.     Cervical back: Normal range of motion.  Skin:    General: Skin is warm and dry.  Neurological:     Mental Status: He is alert.      UC Treatments / Results  Labs (all labs ordered are listed, but only abnormal results are displayed) Labs Reviewed  POCT URINALYSIS DIP (MANUAL ENTRY) - Abnormal; Notable for the following components:      Result Value   Spec Grav, UA >=1.030 (*)    All other components within normal limits  URINE CULTURE    EKG   Radiology No results found.  Procedures Procedures (including critical care time)  Medications Ordered in UC Medications - No data to display  Initial Impression / Assessment and Plan / UC Course  I have reviewed the triage vital signs and the nursing notes.  Pertinent labs & imaging results that were available during my care of the patient were reviewed by me and considered in my medical decision making (see chart for details).     Discussed with patient that unfortunately radiation changes can leave males with incontinence and urinary symptoms.  I will culture his urine and cover him with antibiotics given his current symptoms, however his urinalysis is normal.  He is prepared to see his urologist if he fails to improve Final Clinical Impressions(s) / UC Diagnoses   Final diagnoses:  Recurrent UTI  Lower urinary tract symptoms (LUTS)     Discharge Instructions      I am sending the urine to the laboratory for culture The  result will be available on MyChart You will be called if any change in antibiotic is needed In the meantime take Keflex (cephalexin) 2 times a day Increase your water intake I am afraid your urinary symptoms may be from radiation damage.  This can be treated by your urologist   ED Prescriptions     Medication Sig Dispense Auth. Provider   cephALEXin (KEFLEX) 500 MG capsule Take 1 capsule (500 mg total) by mouth 2 (two) times daily. 14 capsule Maranda Jamee Jacob, MD      PDMP not reviewed this encounter.   Maranda Jamee Jacob, MD 09/27/24 [phone]

## 2024-09-27 NOTE — Discharge Instructions (Signed)
 I am sending the urine to the laboratory for culture The result will be available on MyChart You will be called if any change in antibiotic is needed In the meantime take Keflex (cephalexin) 2 times a day Increase your water intake I am afraid your urinary symptoms may be from radiation damage.  This can be treated by your urologist

## 2024-09-28 ENCOUNTER — Telehealth: Payer: Self-pay

## 2024-09-29 LAB — URINE CULTURE
Culture: NO GROWTH
Special Requests: NORMAL

## 2024-10-10 DIAGNOSIS — E785 Hyperlipidemia, unspecified: Secondary | ICD-10-CM | POA: Diagnosis not present

## 2024-10-10 DIAGNOSIS — N1832 Chronic kidney disease, stage 3b: Secondary | ICD-10-CM | POA: Diagnosis not present

## 2024-10-10 DIAGNOSIS — Z15068 Genetic susceptibility to other malignant neoplasm of digestive system: Secondary | ICD-10-CM | POA: Diagnosis not present

## 2024-10-10 DIAGNOSIS — I25810 Atherosclerosis of coronary artery bypass graft(s) without angina pectoris: Secondary | ICD-10-CM | POA: Diagnosis not present

## 2024-10-10 DIAGNOSIS — Z1509 Genetic susceptibility to other malignant neoplasm: Secondary | ICD-10-CM | POA: Diagnosis not present

## 2024-10-10 DIAGNOSIS — Z1330 Encounter for screening examination for mental health and behavioral disorders, unspecified: Secondary | ICD-10-CM | POA: Diagnosis not present

## 2024-10-10 DIAGNOSIS — R300 Dysuria: Secondary | ICD-10-CM | POA: Diagnosis not present

## 2024-10-10 DIAGNOSIS — C679 Malignant neoplasm of bladder, unspecified: Secondary | ICD-10-CM | POA: Diagnosis not present

## 2024-10-10 DIAGNOSIS — Z1507 Genetic susceptibility to malignant neoplasm of urinary tract: Secondary | ICD-10-CM | POA: Diagnosis not present

## 2024-10-10 DIAGNOSIS — E1169 Type 2 diabetes mellitus with other specified complication: Secondary | ICD-10-CM | POA: Diagnosis not present

## 2024-10-10 DIAGNOSIS — Z15060 Genetic susceptibility to colorectal cancer: Secondary | ICD-10-CM | POA: Diagnosis not present

## 2024-11-17 NOTE — Progress Notes (Signed)
 Brian Strickland                                          MRN: [id number]   11/17/2024   The [facility] Quality Team Specialist reviewed this patient medical record for the purposes of chart review for care gap closure. The following were reviewed: abstraction for care gap closure-controlling blood pressure.    [facility] Quality Team
